# Patient Record
Sex: Male | Born: 1975 | Race: Black or African American | Hispanic: No | Marital: Single | Smoking: Current every day smoker
Health system: Southern US, Community
[De-identification: ages and names within clinical notes are randomized; demographics above are authoritative.]

## PROBLEM LIST (undated history)

## (undated) DIAGNOSIS — M549 Dorsalgia, unspecified: Secondary | ICD-10-CM

## (undated) HISTORY — PX: HERNIA REPAIR: SHX51

---

## 2006-06-21 ENCOUNTER — Emergency Department: Payer: Self-pay | Admitting: Emergency Medicine

## 2006-06-21 ENCOUNTER — Other Ambulatory Visit: Payer: Self-pay

## 2006-08-03 ENCOUNTER — Emergency Department: Payer: Self-pay | Admitting: Emergency Medicine

## 2006-08-03 ENCOUNTER — Other Ambulatory Visit: Payer: Self-pay

## 2013-09-20 ENCOUNTER — Emergency Department: Payer: Self-pay | Admitting: Emergency Medicine

## 2013-09-20 LAB — COMPREHENSIVE METABOLIC PANEL
ALBUMIN: 4.3 g/dL (ref 3.4–5.0)
ALT: 31 U/L (ref 12–78)
Alkaline Phosphatase: 97 U/L
Anion Gap: 5 — ABNORMAL LOW (ref 7–16)
BILIRUBIN TOTAL: 0.5 mg/dL (ref 0.2–1.0)
BUN: 11 mg/dL (ref 7–18)
CALCIUM: 9.5 mg/dL (ref 8.5–10.1)
CHLORIDE: 103 mmol/L (ref 98–107)
CREATININE: 1 mg/dL (ref 0.60–1.30)
Co2: 28 mmol/L (ref 21–32)
EGFR (African American): >60
EGFR (Non-African Amer.): >60
Glucose: 72 mg/dL (ref 65–99)
Osmolality: 270 (ref 275–301)
Potassium: 4 mmol/L (ref 3.5–5.1)
SGOT(AST): 45 U/L — ABNORMAL HIGH (ref 15–37)
SODIUM: 136 mmol/L (ref 136–145)
TOTAL PROTEIN: 8.5 g/dL — AB (ref 6.4–8.2)

## 2013-09-20 LAB — URINALYSIS, COMPLETE
Bacteria: NONE SEEN
Bilirubin,UR: NEGATIVE
Glucose,UR: NEGATIVE mg/dL (ref 0–75)
Ketone: NEGATIVE
Leukocyte Esterase: NEGATIVE
Nitrite: NEGATIVE
Ph: 5 (ref 4.5–8.0)
Protein: NEGATIVE
RBC,UR: <1 /HPF (ref 0–5)
Specific Gravity: 1.02 (ref 1.003–1.030)
Squamous Epithelial: NONE SEEN
WBC UR: <1 /HPF (ref 0–5)

## 2013-09-20 LAB — CBC WITH DIFFERENTIAL/PLATELET
BASOS ABS: 0.1 10*3/uL (ref 0.0–0.1)
Basophil %: 1.2 %
EOS PCT: 0.7 %
Eosinophil #: 0 10*3/uL (ref 0.0–0.7)
HCT: 42.4 % (ref 40.0–52.0)
HGB: 14.3 g/dL (ref 13.0–18.0)
LYMPHS ABS: 1.6 10*3/uL (ref 1.0–3.6)
Lymphocyte %: 30.6 %
MCH: 30.7 pg (ref 26.0–34.0)
MCHC: 33.7 g/dL (ref 32.0–36.0)
MCV: 91 fL (ref 80–100)
MONOS PCT: 5.7 %
Monocyte #: 0.3 x10 3/mm (ref 0.2–1.0)
Neutrophil #: 3.1 10*3/uL (ref 1.4–6.5)
Neutrophil %: 61.8 %
PLATELETS: 282 10*3/uL (ref 150–440)
RBC: 4.66 10*6/uL (ref 4.40–5.90)
RDW: 14 % (ref 11.5–14.5)
WBC: 5.1 10*3/uL (ref 3.8–10.6)

## 2013-09-20 LAB — LIPASE, BLOOD: Lipase: 139 U/L (ref 73–393)

## 2013-10-19 ENCOUNTER — Emergency Department: Payer: Self-pay | Admitting: Internal Medicine

## 2014-02-28 ENCOUNTER — Emergency Department: Payer: Self-pay | Admitting: Emergency Medicine

## 2014-02-28 LAB — URINALYSIS, COMPLETE
Bacteria: NONE SEEN
Bilirubin,UR: NEGATIVE
GLUCOSE, UR: NEGATIVE mg/dL (ref 0–75)
Ketone: NEGATIVE
Leukocyte Esterase: NEGATIVE
NITRITE: NEGATIVE
Ph: 6 (ref 4.5–8.0)
Protein: 30
Specific Gravity: 1.02 (ref 1.003–1.030)
Squamous Epithelial: NONE SEEN
WBC UR: <1 /HPF (ref 0–5)

## 2014-02-28 LAB — CBC WITH DIFFERENTIAL/PLATELET
BASOS PCT: 1.5 %
Basophil #: 0.1 10*3/uL (ref 0.0–0.1)
Eosinophil #: 0 10*3/uL (ref 0.0–0.7)
Eosinophil %: 1.2 %
HCT: 40.4 % (ref 40.0–52.0)
HGB: 13.4 g/dL (ref 13.0–18.0)
LYMPHS ABS: 1.2 10*3/uL (ref 1.0–3.6)
LYMPHS PCT: 30.8 %
MCH: 30.4 pg (ref 26.0–34.0)
MCHC: 33.1 g/dL (ref 32.0–36.0)
MCV: 92 fL (ref 80–100)
Monocyte #: 0.2 x10 3/mm (ref 0.2–1.0)
Monocyte %: 6.2 %
NEUTROS ABS: 2.4 10*3/uL (ref 1.4–6.5)
Neutrophil %: 60.3 %
Platelet: 322 10*3/uL (ref 150–440)
RBC: 4.39 10*6/uL — AB (ref 4.40–5.90)
RDW: 14.1 % (ref 11.5–14.5)
WBC: 3.9 10*3/uL (ref 3.8–10.6)

## 2014-02-28 LAB — COMPREHENSIVE METABOLIC PANEL
ALK PHOS: 78 U/L
Albumin: 3.8 g/dL (ref 3.4–5.0)
Anion Gap: 9 (ref 7–16)
BUN: 8 mg/dL (ref 7–18)
Bilirubin,Total: 0.6 mg/dL (ref 0.2–1.0)
CALCIUM: 8.6 mg/dL (ref 8.5–10.1)
CREATININE: 1.27 mg/dL (ref 0.60–1.30)
Chloride: 102 mmol/L (ref 98–107)
Co2: 27 mmol/L (ref 21–32)
EGFR (African American): >60
EGFR (Non-African Amer.): >60
Glucose: 86 mg/dL (ref 65–99)
Osmolality: 273 (ref 275–301)
Potassium: 4 mmol/L (ref 3.5–5.1)
SGOT(AST): 41 U/L — ABNORMAL HIGH (ref 15–37)
SGPT (ALT): 26 U/L
Sodium: 138 mmol/L (ref 136–145)
Total Protein: 8.2 g/dL (ref 6.4–8.2)

## 2014-02-28 LAB — TROPONIN I: Troponin-I: <0.02 ng/mL

## 2014-02-28 LAB — LIPASE, BLOOD: LIPASE: 110 U/L (ref 73–393)

## 2014-06-25 ENCOUNTER — Emergency Department: Payer: Self-pay | Admitting: Emergency Medicine

## 2014-12-11 ENCOUNTER — Emergency Department: Payer: Self-pay

## 2014-12-11 ENCOUNTER — Emergency Department
Admission: EM | Admit: 2014-12-11 | Discharge: 2014-12-11 | Disposition: A | Discharge status: Home / Self Care | Payer: Self-pay | Attending: Student | Admitting: Student

## 2014-12-11 DIAGNOSIS — M5136 Other intervertebral disc degeneration, lumbar region: Secondary | ICD-10-CM | POA: Insufficient documentation

## 2014-12-11 DIAGNOSIS — M5441 Lumbago with sciatica, right side: Secondary | ICD-10-CM | POA: Insufficient documentation

## 2014-12-11 MED ORDER — ORPHENADRINE CITRATE 30 MG/ML IJ SOLN
INTRAMUSCULAR | Status: AC
  Administered 2014-12-11: 60 mg via INTRAMUSCULAR
  Filled 2014-12-11: qty 2

## 2014-12-11 MED ORDER — CYCLOBENZAPRINE HCL 10 MG PO TABS: 10.0000 mg | ORAL_TABLET | Freq: Three times a day (TID) | ORAL | Status: DC | PRN

## 2014-12-11 MED ORDER — CYCLOBENZAPRINE HCL 10 MG PO TABS: 10.0000 mg | ORAL_TABLET | Freq: Three times a day (TID) | ORAL | Status: AC | PRN

## 2014-12-11 MED ORDER — KETOROLAC TROMETHAMINE 30 MG/ML IJ SOLN
INTRAMUSCULAR | Status: AC
  Administered 2014-12-11: 60 mg via INTRAMUSCULAR
  Filled 2014-12-11: qty 2

## 2014-12-11 MED ORDER — MELOXICAM 15 MG PO TABS
15.0000 mg | ORAL_TABLET | Freq: Every day | ORAL | Status: AC | PRN
Start: 2014-12-11 — End: ?

## 2014-12-11 MED ORDER — MELOXICAM 15 MG PO TABS: 15.0000 mg | ORAL_TABLET | Freq: Every day | ORAL | Status: DC | PRN

## 2014-12-11 MED ORDER — KETOROLAC TROMETHAMINE 30 MG/ML IJ SOLN
60.0000 mg | Freq: Once | INTRAMUSCULAR | Status: AC
  Administered 2014-12-11: 60 mg via INTRAMUSCULAR

## 2014-12-11 MED ORDER — ORPHENADRINE CITRATE 30 MG/ML IJ SOLN
60.0000 mg | Freq: Once | INTRAMUSCULAR | Status: AC
  Administered 2014-12-11: 60 mg via INTRAMUSCULAR

## 2014-12-11 NOTE — ED Notes (Signed)
Pt states that when he bent over he started having lower back pain, pt states that the pain will radiate down both of his legs at times, denies any known injury
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
## 2014-12-11 NOTE — Discharge Instructions (Signed)
Take medication as prescribed. Avoid strenuous activity. Avoid heavy lifting. Apply ice.  Follow up with orthopedic next week. See above to call and schedule. Follow up is very important.   REturn to ER for new or worsening concerns.   Back Exercises Back exercises help treat and prevent back injuries. The goal of back exercises is to increase the strength of your abdominal and back muscles and the flexibility of your back. These exercises should be started when you no longer have back pain. Back exercises include:  Pelvic Tilt. Lie on your back with your knees bent. Tilt your pelvis until the lower part of your back is against the floor. Hold this position 5 to 10 sec and repeat 5 to 10 times.  Knee to Chest. Pull first 1 knee up against your chest and hold for 20 to 30 seconds, repeat this with the other knee, and then both knees. This may be done with the other leg straight or bent, whichever feels better.  Sit-Ups or Curl-Ups. Bend your knees 90 degrees. Start with tilting your pelvis, and do a partial, slow sit-up, lifting your trunk only 30 to 45 degrees off the floor. Take at least 2 to 3 seconds for each sit-up. Do not do sit-ups with your knees out straight. If partial sit-ups are difficult, simply do the above but with only tightening your abdominal muscles and holding it as directed.  Hip-Lift. Lie on your back with your knees flexed 90 degrees. Push down with your feet and shoulders as you raise your hips a couple inches off the floor; hold for 10 seconds, repeat 5 to 10 times.  Back arches. Lie on your stomach, propping yourself up on bent elbows. Slowly press on your hands, causing an arch in your low back. Repeat 3 to 5 times. Any initial stiffness and discomfort should lessen with repetition over time.  Shoulder-Lifts. Lie face down with arms beside your body. Keep hips and torso pressed to floor as you slowly lift your head and shoulders off the floor. Do not overdo your  exercises, especially in the beginning. Exercises may cause you some mild back discomfort which lasts for a few minutes; however, if the pain is more severe, or lasts for more than 15 minutes, do not continue exercises until you see your caregiver. Improvement with exercise therapy for back problems is slow.  See your caregivers for assistance with developing a proper back exercise program. Document Released: 08/31/2004 Document Revised: 10/16/2011 Document Reviewed: 05/25/2011 [facility] Patient Information 2015 [place], [place]. This information is not intended to replace advice given to you by your health care provider. Make sure you discuss any questions you have with your health care provider.  Degenerative Disk Disease Degenerative disk disease is a condition caused by the changes that occur in the cushions of the backbone (spinal disks) as you grow older. Spinal disks are soft and compressible disks located between the bones of the spine (vertebrae). They act like shock absorbers. Degenerative disk disease can affect the whole spine. However, the neck and lower back are most commonly affected. Many changes can occur in the spinal disks with aging, such as:  The spinal disks may dry and shrink.  Small tears may occur in the tough, outer covering of the disk (annulus).  The disk space may become smaller due to loss of water.  Abnormal growths in the bone (spurs) may occur. This can put pressure on the nerve roots exiting the spinal canal, causing pain.  The spinal  canal may become narrowed. CAUSES  Degenerative disk disease is a condition caused by the changes that occur in the spinal disks with aging. The exact cause is not known, but there is a genetic basis for many patients. Degenerative changes can occur due to loss of fluid in the disk. This makes the disk thinner and reduces the space between the backbones. Small cracks can develop in the outer layer of the disk. This can lead to the  breakdown of the disk. You are more likely to get degenerative disk disease if you are overweight. Smoking cigarettes and doing heavy work such as weightlifting can also increase your risk of this condition. Degenerative changes can start after a sudden injury. Growth of bone spurs can compress the nerve roots and cause pain.  SYMPTOMS  The symptoms vary from person to person. Some people may have no pain, while others have severe pain. The pain may be so severe that it can limit your activities. The location of the pain depends on the part of your backbone that is affected. You will have neck or arm pain if a disk in the neck area is affected. You will have pain in your back, buttocks, or legs if a disk in the lower back is affected. The pain becomes worse while bending, reaching up, or with twisting movements. The pain may start gradually and then get worse as time passes. It may also start after a major or minor injury. You may feel numbness or tingling in the arms or legs.  DIAGNOSIS  Your caregiver will ask you about your symptoms and about activities or habits that may cause the pain. He or she may also ask about any injuries, diseases, or treatments you have had earlier. Your caregiver will examine you to check for the range of movement that is possible in the affected area, to check for strength in your extremities, and to check for sensation in the areas of the arms and legs supplied by different nerve roots. An X-ray of the spine may be taken. Your caregiver may suggest other imaging tests, such as magnetic resonance imaging (MRI), if needed.  TREATMENT  Treatment includes rest, modifying your activities, and applying ice and heat. Your caregiver may prescribe medicines to reduce your pain and may ask you to do some exercises to strengthen your back. In some cases, you may need surgery. You and your caregiver will decide on the treatment that is best for you. HOME CARE INSTRUCTIONS   Follow proper  lifting and walking techniques as advised by your caregiver.  Maintain good posture.  Exercise regularly as advised.  Perform relaxation exercises.  Change your sitting, standing, and sleeping habits as advised. Change positions frequently.  Lose weight as advised.  Stop smoking if you smoke.  Wear supportive footwear. SEEK MEDICAL CARE IF:  Your pain does not go away within 1 to 4 weeks. SEEK IMMEDIATE MEDICAL CARE IF:   Your pain is severe.  You notice weakness in your arms, hands, or legs.  You begin to lose control of your bladder or bowel movements. MAKE SURE YOU:   Understand these instructions.  Will watch your condition.  Will get help right away if you are not doing well or get worse. Document Released: 05/21/2007 Document Revised: 10/16/2011 Document Reviewed: 11/25/2013 [facility] Patient Information 2015 [place], [place]. This information is not intended to replace advice given to you by your health care provider. Make sure you discuss any questions you have with your health care  provider.  Back Pain, Adult Back pain is very common. The pain often gets better over time. The cause of back pain is usually not dangerous. Most people can learn to manage their back pain on their own.  HOME CARE   Stay active. Start with short walks on flat ground if you can. Try to walk farther each day.  Do not sit, drive, or stand in one place for more than 30 minutes. Do not stay in bed.  Do not avoid exercise or work. Activity can help your back heal faster.  Be careful when you bend or lift an object. Bend at your knees, keep the object close to you, and do not twist.  Sleep on a firm mattress. Lie on your side, and bend your knees. If you lie on your back, put a pillow under your knees.  Only take medicines as told by your doctor.  Put ice on the injured area.  Put ice in a plastic bag.  Place a towel between your skin and the bag.  Leave the ice on for 15-20 minutes,  03-04 times a day for the first 2 to 3 days. After that, you can switch between ice and heat packs.  Ask your doctor about back exercises or massage.  Avoid feeling anxious or stressed. Find good ways to deal with stress, such as exercise. GET HELP RIGHT AWAY IF:   Your pain does not go away with rest or medicine.  Your pain does not go away in 1 week.  You have new problems.  You do not feel well.  The pain spreads into your legs.  You cannot control when you poop (bowel movement) or pee (urinate).  Your arms or legs feel weak or lose feeling (numbness).  You feel sick to your stomach (nauseous) or throw up (vomit).  You have belly (abdominal) pain.  You feel like you may pass out (faint). MAKE SURE YOU:   Understand these instructions.  Will watch your condition.  Will get help right away if you are not doing well or get worse. Document Released: 01/10/2008 Document Revised: 10/16/2011 Document Reviewed: 11/25/2013 [facility] Patient Information 2015 [place], [place]. This information is not intended to replace advice given to you by your health care provider. Make sure you discuss any questions you have with your health care provider.

## 2015-01-28 ENCOUNTER — Emergency Department
Admission: EM | Admit: 2015-01-28 | Discharge: 2015-01-28 | Disposition: A | Discharge status: Home / Self Care | Payer: Self-pay | Attending: Emergency Medicine | Admitting: Emergency Medicine

## 2015-01-28 ENCOUNTER — Other Ambulatory Visit: Payer: Self-pay

## 2015-01-28 DIAGNOSIS — R112 Nausea with vomiting, unspecified: Secondary | ICD-10-CM | POA: Insufficient documentation

## 2015-01-28 DIAGNOSIS — R109 Unspecified abdominal pain: Secondary | ICD-10-CM | POA: Insufficient documentation

## 2015-01-28 DIAGNOSIS — J029 Acute pharyngitis, unspecified: Secondary | ICD-10-CM | POA: Insufficient documentation

## 2015-01-28 DIAGNOSIS — Z720 Tobacco use: Secondary | ICD-10-CM | POA: Insufficient documentation

## 2015-01-28 DIAGNOSIS — R197 Diarrhea, unspecified: Secondary | ICD-10-CM | POA: Insufficient documentation

## 2015-01-28 HISTORY — DX: Dorsalgia, unspecified: M54.9

## 2015-01-28 LAB — URINALYSIS COMPLETE WITH MICROSCOPIC (ARMC ONLY)
BILIRUBIN URINE: NEGATIVE
Bacteria, UA: NONE SEEN
GLUCOSE, UA: NEGATIVE mg/dL
Hgb urine dipstick: NEGATIVE
Ketones, ur: NEGATIVE mg/dL
LEUKOCYTES UA: NEGATIVE
Nitrite: NEGATIVE
PROTEIN: NEGATIVE mg/dL
Specific Gravity, Urine: 1.001 — ABNORMAL LOW (ref 1.005–1.030)
Squamous Epithelial / LPF: NONE SEEN
WBC UA: NONE SEEN WBC/hpf (ref 0–5)
pH: 6 (ref 5.0–8.0)

## 2015-01-28 LAB — COMPREHENSIVE METABOLIC PANEL
ALT: 32 U/L (ref 17–63)
AST: 47 U/L — ABNORMAL HIGH (ref 15–41)
Albumin: 4.5 g/dL (ref 3.5–5.0)
Alkaline Phosphatase: 81 U/L (ref 38–126)
Anion gap: 10 (ref 5–15)
BUN: 8 mg/dL (ref 6–20)
CALCIUM: 9.2 mg/dL (ref 8.9–10.3)
CO2: 26 mmol/L (ref 22–32)
CREATININE: 0.99 mg/dL (ref 0.61–1.24)
Chloride: 100 mmol/L — ABNORMAL LOW (ref 101–111)
GFR calc Af Amer: >60 mL/min (ref >60)
GFR calc non Af Amer: >60 mL/min (ref >60)
Glucose, Bld: 106 mg/dL — ABNORMAL HIGH (ref 65–99)
Potassium: 3.5 mmol/L (ref 3.5–5.1)
Sodium: 136 mmol/L (ref 135–145)
Total Bilirubin: 1 mg/dL (ref 0.3–1.2)
Total Protein: 8.5 g/dL — ABNORMAL HIGH (ref 6.5–8.1)

## 2015-01-28 LAB — CBC WITH DIFFERENTIAL/PLATELET
BASOS PCT: 1 %
Basophils Absolute: 0 10*3/uL (ref 0–0.1)
EOS PCT: 1 %
Eosinophils Absolute: 0 10*3/uL (ref 0–0.7)
HCT: 41.9 % (ref 40.0–52.0)
HEMOGLOBIN: 14 g/dL (ref 13.0–18.0)
Lymphocytes Relative: 29 %
Lymphs Abs: 1.1 10*3/uL (ref 1.0–3.6)
MCH: 30.3 pg (ref 26.0–34.0)
MCHC: 33.3 g/dL (ref 32.0–36.0)
MCV: 90.7 fL (ref 80.0–100.0)
MONOS PCT: 9 %
Monocytes Absolute: 0.4 10*3/uL (ref 0.2–1.0)
NEUTROS ABS: 2.4 10*3/uL (ref 1.4–6.5)
Neutrophils Relative %: 60 %
Platelets: 227 10*3/uL (ref 150–440)
RBC: 4.61 MIL/uL (ref 4.40–5.90)
RDW: 13.2 % (ref 11.5–14.5)
WBC: 3.9 10*3/uL (ref 3.8–10.6)

## 2015-01-28 LAB — LIPASE, BLOOD: LIPASE: 56 U/L — AB (ref 22–51)

## 2015-01-28 MED ORDER — METOCLOPRAMIDE HCL 10 MG PO TABS
10.0000 mg | ORAL_TABLET | Freq: Once | ORAL | Status: AC
  Administered 2015-01-28: 10 mg via ORAL

## 2015-01-28 MED ORDER — METOCLOPRAMIDE HCL 10 MG PO TABS
ORAL_TABLET | ORAL | Status: AC
  Administered 2015-01-28: 10 mg via ORAL
  Filled 2015-01-28: qty 1

## 2015-01-28 MED ORDER — METOCLOPRAMIDE HCL 5 MG PO TABS: 5.0000 mg | ORAL_TABLET | Freq: Three times a day (TID) | ORAL | Status: AC

## 2015-01-28 NOTE — Discharge Instructions (Signed)

## 2015-01-28 NOTE — ED Notes (Signed)
Pt c/o vomiting and diarrhea that began last night. Also reports center abdominal pain.

## 2015-01-28 NOTE — ED Notes (Signed)
Pt reports feeling "hot" and head pain beginning yesterday. Pt reports coughing up yellow colored sputum since last night and having throat irritation.

## 2015-01-28 NOTE — ED Provider Notes (Signed)
[facility] Emergency Department Provider Note  ____________________________________________  Time seen: 1451   I have reviewed the triage vital signs and the nursing notes.   HISTORY  Chief Complaint Emesis; Abdominal Pain; and Diarrhea     HPI Barry Olson is a 39 y.o. male reports he woke up this morning and did not feel well. He felt warm and had significant and momentarily. He went to the bathroom and had some diarrhea and left the bathroom only to return minute later and vomited. Following emesis, he reports his throat does not feel well. It is a little uncomfortable when he swallows. He has tried to eat other foods today but has not been able to tolerate much. He did have some abdominal pain. He points towards the middle of his belly just above the umbilicus, but that pain is now gone. He had a mild headache as well but that is improved. He has not taking any medicines today except for an 800 mg ibuprofen area.     Past Medical History  Diagnosis Date  . Back pain     There are no active problems to display for this patient.   History reviewed. No pertinent past surgical history.  Current Outpatient Rx  Name  Route  Sig  Dispense  Refill  . cyclobenzaprine (FLEXERIL) 10 MG tablet   Oral   Take 1 tablet (10 mg total) by mouth every 8 (eight) hours as needed for muscle spasms (PRN pain. Do not drive or operate heavy machinery while taking as can cause drowsiness.).   12 tablet   0   . meloxicam (MOBIC) 15 MG tablet   Oral   Take 1 tablet (15 mg total) by mouth daily as needed for pain.   10 tablet   0   . metoCLOPramide (REGLAN) 5 MG tablet   Oral   Take 1 tablet (5 mg total) by mouth 3 (three) times daily.   15 tablet   0     Allergies Review of patient's allergies indicates no known allergies.  No family history on file.  Social History History  Substance Use Topics  . Smoking status: Current Every Day Smoker  .  Smokeless tobacco: Not on file  . Alcohol Use: Yes    Review of Systems  Constitutional: Negative for fever. ENT: Positive for sore throat. See history of present illness Cardiovascular: Negative for chest pain. Respiratory: Negative for shortness of breath. Gastrointestinal: Positive for abdominal pain, vomiting and diarrhea. See history of present illness . Genitourinary: Negative for dysuria. Musculoskeletal: Patient reports that he has some back problems. Has an appointment was [facility] clinic soon for further evaluation of this. Skin: Negative for rash. Neurological: Negative for headaches   10-point ROS otherwise negative.  ____________________________________________   PHYSICAL EXAM:  VITAL SIGNS: ED Triage Vitals  Enc Vitals Group     BP 01/28/15 1324 142/94 mmHg     Pulse --      Resp 01/28/15 1324 20     Temp 01/28/15 1324 98.4 F (36.9 C)     Temp Source 01/28/15 1324 Oral     SpO2 01/28/15 1324 96 %     Weight 01/28/15 1324 170 lb (77.111 kg)     Height 01/28/15 1324 5\' 11"  (1.803 m)     Head Cir --      Peak Flow --      Pain Score 01/28/15 1329 10     Pain Loc --  Pain Edu? --      Excl. in GC? --     Constitutional:  Alert and oriented. Well appearing and in no distress. ENT   Head: Normocephalic and atraumatic.   Nose: No congestion/rhinnorhea.   Mouth/Throat: Mucous membranes are moist. No erythema, no swelling. Neck:  Minimal lymphadenopathy Cardiovascular: Normal rate, regular rhythm, no murmur noted Respiratory:  Normal respiratory effort, no tachypnea.    Breath sounds are clear and equal bilaterally.  Gastrointestinal: Soft and nontender. No distention. Benign exam Back: No muscle spasm, no tenderness, no CVA tenderness. Musculoskeletal: No deformity noted. Nontender with normal range of motion in all extremities.  No noted edema. Neurologic:  Normal speech and language. No gross focal neurologic deficits are appreciated.   Skin:  Skin is warm, dry. No rash noted. Psychiatric: Patient is a little bit vague and uses minimal words and describing his ailment. Otherwise, he appears to have an intact thought process and a normal affect.  ____________________________________________    LABS (pertinent positives/negatives)  Metabolic panel is generally within normal limits with minimal elevation of AST at 47. CBC is normal with white blood cell count of 3.9 Lipase is slightly elevated at 56. Urinalysis is negative for blood or infection. Negative for ketones. ____________________________________________   EKG  ED ECG REPORT I, Cooper Moroney W, the attending physician, personally viewed and interpreted this ECG.   Date: 01/28/2015  EKG Time: 1336  Rate: 105  Rhythm: Sinus tachycardia  Axis: Normal  Intervals: Normal  ST&T Change: Flat T in 3, aVF, V5 V6   ____________________________________________    RADIOLOGY    ____________________________________________   PROCEDURES    ____________________________________________   INITIAL IMPRESSION / ASSESSMENT AND PLAN / ED COURSE  Pertinent labs & imaging results that were available during my care of the patient were reviewed by me and considered in my medical decision making (see chart for details).   Generally well-appearing 39 year old male in no acute distress. With his nausea vomiting diarrhea but a benign abdomen, I believe he likely has a benign gastrointestinal infection. He reports his abdomen feels better and he has no nausea at this time. He has had difficulty with his appetite or keeping food down today. With this in mind and after discussing with the patient, I will treat the patient with Reglan. I'll write a prescription for additional Reglan as needed.  ____________________________________________   FINAL CLINICAL IMPRESSION(S) / ED DIAGNOSES  Final diagnoses:  Non-intractable vomiting with nausea, vomiting of unspecified  type  Diarrhea      Darien Ramus, MD 01/28/15 [phone]
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
## 2017-09-04 ENCOUNTER — Other Ambulatory Visit: Payer: Self-pay

## 2017-09-04 ENCOUNTER — Emergency Department: Payer: Self-pay

## 2017-09-04 ENCOUNTER — Encounter: Payer: Self-pay | Admitting: Emergency Medicine

## 2017-09-04 ENCOUNTER — Emergency Department
Admission: EM | Admit: 2017-09-04 | Discharge: 2017-09-04 | Disposition: A | Discharge status: Home / Self Care | Payer: Self-pay | Attending: Emergency Medicine | Admitting: Emergency Medicine

## 2017-09-04 DIAGNOSIS — Z79899 Other long term (current) drug therapy: Secondary | ICD-10-CM | POA: Insufficient documentation

## 2017-09-04 DIAGNOSIS — R079 Chest pain, unspecified: Secondary | ICD-10-CM | POA: Insufficient documentation

## 2017-09-04 DIAGNOSIS — J069 Acute upper respiratory infection, unspecified: Secondary | ICD-10-CM | POA: Insufficient documentation

## 2017-09-04 DIAGNOSIS — F17200 Nicotine dependence, unspecified, uncomplicated: Secondary | ICD-10-CM | POA: Insufficient documentation

## 2017-09-04 LAB — BASIC METABOLIC PANEL
ANION GAP: 10 (ref 5–15)
BUN: 11 mg/dL (ref 6–20)
CO2: 24 mmol/L (ref 22–32)
Calcium: 9 mg/dL (ref 8.9–10.3)
Chloride: 102 mmol/L (ref 101–111)
Creatinine, Ser: 1.14 mg/dL (ref 0.61–1.24)
GLUCOSE: 89 mg/dL (ref 65–99)
POTASSIUM: 3.5 mmol/L (ref 3.5–5.1)
SODIUM: 136 mmol/L (ref 135–145)

## 2017-09-04 LAB — CBC
HEMATOCRIT: 37.5 % — AB (ref 40.0–52.0)
HEMOGLOBIN: 12.6 g/dL — AB (ref 13.0–18.0)
MCH: 30.3 pg (ref 26.0–34.0)
MCHC: 33.5 g/dL (ref 32.0–36.0)
MCV: 90.3 fL (ref 80.0–100.0)
Platelets: 203 10*3/uL (ref 150–440)
RBC: 4.16 MIL/uL — ABNORMAL LOW (ref 4.40–5.90)
RDW: 14.4 % (ref 11.5–14.5)
WBC: 6.1 10*3/uL (ref 3.8–10.6)

## 2017-09-04 LAB — TROPONIN I: Troponin I: <0.03 ng/mL (ref <0.03)

## 2017-09-04 MED ORDER — GUAIFENESIN ER 600 MG PO TB12: 600.0000 mg | ORAL_TABLET | Freq: Two times a day (BID) | ORAL | 0 refills | Status: AC

## 2017-09-04 MED ORDER — IPRATROPIUM-ALBUTEROL 0.5-2.5 (3) MG/3ML IN SOLN
3.0000 mL | Freq: Once | RESPIRATORY_TRACT | Status: AC
  Administered 2017-09-04: 3 mL via RESPIRATORY_TRACT
  Filled 2017-09-04: qty 3

## 2017-09-04 MED ORDER — ACETAMINOPHEN 325 MG PO TABS
650.0000 mg | ORAL_TABLET | Freq: Once | ORAL | Status: AC
  Administered 2017-09-04: 650 mg via ORAL
  Filled 2017-09-04: qty 2

## 2017-09-04 NOTE — ED Notes (Signed)
Pt c/o cough with congestion for the past couple of days, states having some pain in chest with the coughing. Respirations WNL, skin is warm and dry. States he has not taken any OTC meds for cold or cough.

## 2017-09-04 NOTE — ED Provider Notes (Addendum)
[facility] Emergency Department Provider Note   ____________________________________________    I have reviewed the triage vital signs and the nursing notes.   HISTORY  Chief Complaint Cough and Chest Pain     HPI Barry Olson is a 42 y.o. male who presents with complaints of cough and chest discomfort.  Patient reports he developed cough 3 days ago as well as runny nose and fatigue.  He reports cough is productive of yellow sputum over the last 2 days he has felt discomfort whenever he coughs like his chest is raw.  Has not taken anything for this.  No fevers or chills.  No myalgias.  No nausea or vomiting or abdominal pain   Past Medical History:  Diagnosis Date  . Back pain     There are no active problems to display for this patient.   Past Surgical History:  Procedure Laterality Date  . HERNIA REPAIR      Prior to Admission medications   Medication Sig Start Date End Date Taking? Authorizing Provider  cyclobenzaprine (FLEXERIL) 10 MG tablet Take 1 tablet (10 mg total) by mouth every 8 (eight) hours as needed for muscle spasms (PRN pain. Do not drive or operate heavy machinery while taking as can cause drowsiness.). 12/11/14   Renford Dills, NP  guaiFENesin (MUCINEX) 600 MG 12 hr tablet Take 1 tablet (600 mg total) by mouth 2 (two) times daily for 10 days. 09/04/17 09/14/17  Jene Every, MD  meloxicam (MOBIC) 15 MG tablet Take 1 tablet (15 mg total) by mouth daily as needed for pain. 12/11/14   Renford Dills, NP  metoCLOPramide (REGLAN) 5 MG tablet Take 1 tablet (5 mg total) by mouth 3 (three) times daily. 01/28/15   Darien Ramus, MD     Allergies Patient has no known allergies.  No family history on file.  Social History Social History   Tobacco Use  . Smoking status: Current Every Day Smoker  . Smokeless tobacco: Never Used  Substance Use Topics  . Alcohol use: Yes  . Drug use: Not on file    Review of  Systems  Constitutional: No fever/chills Eyes: No visual changes.  ENT: No sore throat. Cardiovascular: As above Respiratory: Denies shortness of breath.  Cough Gastrointestinal: No abdominal pain.  Genitourinary: Negative for dysuria. Musculoskeletal: No myalgias Skin: Negative for rash. Neurological: Negative for headaches    ____________________________________________   PHYSICAL EXAM:  VITAL SIGNS: ED Triage Vitals  Enc Vitals Group     BP 09/04/17 1039 (!) 181/110     Pulse Rate 09/04/17 1039 90     Resp 09/04/17 1039 18     Temp 09/04/17 1039 97.8 F (36.6 C)     Temp Source 09/04/17 1039 Oral     SpO2 09/04/17 1039 98 %     Weight 09/04/17 1040 77.1 kg (170 lb)     Height 09/04/17 1040 1.803 m (5\' 11" )     Head Circumference --      Peak Flow --      Pain Score 09/04/17 1039 7     Pain Loc --      Pain Edu? --      Excl. in GC? --     Constitutional: Alert and oriented. No acute distress. Pleasant and interactive Eyes: Conjunctivae are normal.   Nose: Positive congestion Mouth/Throat: Mucous membranes are moist.    Cardiovascular: Normal rate, regular rhythm. Grossly normal heart sounds.  Good peripheral circulation. Respiratory:  Normal respiratory effort.  No retractions. Lungs CTAB.  Scattered minimal wheezing Gastrointestinal: Soft and nontender. No distention.   Genitourinary: deferred Musculoskeletal:   Warm and well perfused Neurologic:  Normal speech and language. No gross focal neurologic deficits are appreciated.  Skin:  Skin is warm, dry and intact. No rash noted. Psychiatric: Mood and affect are normal. Speech and behavior are normal.  ____________________________________________   LABS (all labs ordered are listed, but only abnormal results are displayed)  Labs Reviewed  CBC - Abnormal; Notable for the following components:      Result Value   RBC 4.16 (*)    Hemoglobin 12.6 (*)    HCT 37.5 (*)    All other components within normal  limits  BASIC METABOLIC PANEL  TROPONIN I   ____________________________________________  EKG  ED ECG REPORT I, Jene Everyobert Jamire Shabazz, the attending physician, personally viewed and interpreted this ECG.  Date: 09/04/2017  Rhythm: normal sinus rhythm QRS Axis: normal Intervals: normal ST/T Wave abnormalities: normal Narrative Interpretation: no evidence of acute ischemia  ____________________________________________  RADIOLOGY  Chest x-ray unremarkable ____________________________________________   PROCEDURES  Procedure(s) performed: No  Procedures   Critical Care performed: No ____________________________________________   INITIAL IMPRESSION / ASSESSMENT AND PLAN / ED COURSE  Pertinent labs & imaging results that were available during my care of the patient were reviewed by me and considered in my medical decision making (see chart for details).  Patient well-appearing in no acute distress.  Exam is most consistent with upper respiratory infection, no indication of bacterial illness.  No indication that this is related ACS.  EKG normal.  Troponin normal.  Lab work unremarkable.  Treated with DuoNeb and will discharge with Mucinex recommend supportive care outpatient follow-up as needed.  Return precautions discussed    ____________________________________________   FINAL CLINICAL IMPRESSION(S) / ED DIAGNOSES  Final diagnoses:  Viral upper respiratory tract infection        Note:  This document was prepared using Dragon voice recognition software and may include unintentional dictation errors.    Jene EveryKinner, Sharika Mosquera, MD 09/04/17 1230    Jene EveryKinner, Alan Riles, MD 09/04/17 1231

## 2017-09-04 NOTE — ED Triage Notes (Signed)
C/O productive cough and chest pain with cough and deep breathing and sinus congestion x 2 days.

## 2017-09-20 ENCOUNTER — Other Ambulatory Visit: Payer: Self-pay

## 2017-09-20 ENCOUNTER — Encounter: Payer: Self-pay | Admitting: Emergency Medicine

## 2017-09-20 ENCOUNTER — Emergency Department
Admission: EM | Admit: 2017-09-20 | Discharge: 2017-09-20 | Disposition: A | Discharge status: Home / Self Care | Payer: Self-pay | Attending: Emergency Medicine | Admitting: Emergency Medicine

## 2017-09-20 DIAGNOSIS — Z79899 Other long term (current) drug therapy: Secondary | ICD-10-CM | POA: Insufficient documentation

## 2017-09-20 DIAGNOSIS — F17200 Nicotine dependence, unspecified, uncomplicated: Secondary | ICD-10-CM | POA: Insufficient documentation

## 2017-09-20 DIAGNOSIS — H1031 Unspecified acute conjunctivitis, right eye: Secondary | ICD-10-CM | POA: Insufficient documentation

## 2017-09-20 MED ORDER — TOBRAMYCIN 0.3 % OP SOLN: 2.0000 [drp] | OPHTHALMIC | 0 refills | Status: AC

## 2017-09-20 NOTE — ED Triage Notes (Signed)
Presents with pain and swelling to right eye  Sx's started yesterday

## 2017-09-20 NOTE — Discharge Instructions (Signed)
Follow-up with your regular doctor or the ophthalmologist who we have referred you to if you are not better in 3 days.  Use medication as prescribed.  You are contagious until you have had at least 24 hours of eyedrops.  He will be given a work note to indicate this.  Please wash your hands every time you touch her eye.

## 2017-09-20 NOTE — ED Provider Notes (Signed)
[facility] Emergency Department Provider Note  ____________________________________________   First MD Initiated Contact with Patient 09/20/17 1009     (approximate)  I have reviewed the triage vital signs and the nursing notes.   HISTORY  Chief Complaint Eye Pain    HPI Barry Olson is a 42 y.o. male until swelling to the right eye with some denies any cold symptoms.  He states that the eye has been matted in the mornings.  He was sent home from work yesterday because they are afraid he has pinkeye.  He denies any visual changes.  Past Medical History:  Diagnosis Date  . Back pain     There are no active problems to display for this patient.   Past Surgical History:  Procedure Laterality Date  . HERNIA REPAIR      Prior to Admission medications   Medication Sig Start Date End Date Taking? Authorizing Provider  cyclobenzaprine (FLEXERIL) 10 MG tablet Take 1 tablet (10 mg total) by mouth every 8 (eight) hours as needed for muscle spasms (PRN pain. Do not drive or operate heavy machinery while taking as can cause drowsiness.). 12/11/14   Renford Dills, NP  meloxicam (MOBIC) 15 MG tablet Take 1 tablet (15 mg total) by mouth daily as needed for pain. 12/11/14   Renford Dills, NP  metoCLOPramide (REGLAN) 5 MG tablet Take 1 tablet (5 mg total) by mouth 3 (three) times daily. 01/28/15   Darien Ramus, MD  tobramycin (TOBREX) 0.3 % ophthalmic solution Place 2 drops into the left eye every 4 (four) hours. 09/20/17   Faythe Ghee, PA-C    Allergies Patient has no known allergies.  No family history on file.  Social History Social History   Tobacco Use  . Smoking status: Current Every Day Smoker  . Smokeless tobacco: Never Used  Substance Use Topics  . Alcohol use: Yes  . Drug use: Not on file    Review of Systems  Constitutional: No fever/chills Eyes: No visual changes.  Positive for right eye redness and swelling ENT: No sore  throat. Respiratory: Denies cough Genitourinary: Negative for dysuria. Musculoskeletal: Negative for back pain. Skin: Negative for rash.    ____________________________________________   PHYSICAL EXAM:  VITAL SIGNS: ED Triage Vitals  Enc Vitals Group     BP 09/20/17 1011 (!) 149/87     Pulse Rate 09/20/17 1011 77     Resp 09/20/17 1011 16     Temp 09/20/17 1011 97.8 F (36.6 C)     Temp Source 09/20/17 1011 Oral     SpO2 09/20/17 1011 98 %     Weight 09/20/17 0927 170 lb (77.1 kg)     Height 09/20/17 0927 5\' 11"  (1.803 m)     Head Circumference --      Peak Flow --      Pain Score 09/20/17 0927 5     Pain Loc --      Pain Edu? --      Excl. in GC? --     Constitutional: Alert and oriented. Well appearing and in no acute distress. Eyes: Conjunctiva of the right eye is red and swollen.  There is no obvious drainage or matting at this time.  PE RRL.  Patient's vision is grossly intact.  Head: Atraumatic. Nose: No congestion/rhinnorhea. Mouth/Throat: Mucous membranes are moist.  Throat is normal Neck: Is supple, no lymphadenopathy is palpated Cardiovascular: Normal rate, regular rhythm.  Heart sounds are normal Respiratory: Normal  respiratory effort.  No retractions, lungs are clear to auscultation GU: deferred Musculoskeletal: FROM all extremities, warm and well perfused Neurologic:  Normal speech and language.  Skin:  Skin is warm, dry and intact. No rash noted. Psychiatric: Mood and affect are normal. Speech and behavior are normal.  ____________________________________________   LABS (all labs ordered are listed, but only abnormal results are displayed)  Labs Reviewed - No data to display ____________________________________________   ____________________________________________  RADIOLOGY    ____________________________________________   PROCEDURES  Procedure(s) performed:  No  Procedures    ____________________________________________   INITIAL IMPRESSION / ASSESSMENT AND PLAN / ED COURSE  Pertinent labs & imaging results that were available during my care of the patient were reviewed by me and considered in my medical decision making (see chart for details).  Patient is a 42 year old male presents emergency department complains of right eye redness and swelling.  Physical exam the right eye has injected conjunctiva.  Patient's vision is intact.  There is no drainage or matting at this time diagnosis is acute conjunctivitis  A prescription for tobramycin ophthalmic drops was provided.  Work note was provided.  Patient was instructed to wash his hands frequently especially after he touches his eye.  He was informed that he is contagious until he has had at least 24 hours of antibiotic eyedrops.  Patient states he understands will comply with the recommendations.  He was discharged in stable condition     As part of my medical decision making, I reviewed the following data within the electronic MEDICAL RECORD NUMBER Nursing notes reviewed and incorporated, Old chart reviewed, Notes from prior ED visits  ____________________________________________   FINAL CLINICAL IMPRESSION(S) / ED DIAGNOSES  Final diagnoses:  Acute bacterial conjunctivitis of right eye      NEW MEDICATIONS STARTED DURING THIS VISIT:  New Prescriptions   TOBRAMYCIN (TOBREX) 0.3 % OPHTHALMIC SOLUTION    Place 2 drops into the left eye every 4 (four) hours.     Note:  This document was prepared using Dragon voice recognition software and may include unintentional dictation errors.    Faythe GheeFisher, Haakon Titsworth W, PA-C 09/20/17 1034    Don PerkingVeronese, [place], MD 09/20/17 [phone]

## 2021-02-15 ENCOUNTER — Ambulatory Visit: Payer: Medicaid Other | Admitting: Physical Therapy

## 2021-02-23 ENCOUNTER — Ambulatory Visit: Payer: Self-pay | Admitting: Physical Therapy

## 2021-03-02 ENCOUNTER — Ambulatory Visit: Payer: Self-pay | Admitting: Physical Therapy

## 2021-03-09 ENCOUNTER — Ambulatory Visit: Payer: Self-pay | Admitting: Physical Therapy

## 2021-04-18 ENCOUNTER — Ambulatory Visit: Payer: Medicaid Other | Attending: Internal Medicine | Admitting: Physical Therapy

## 2021-04-18 ENCOUNTER — Other Ambulatory Visit: Payer: Self-pay

## 2021-04-18 ENCOUNTER — Encounter: Payer: Self-pay | Admitting: Physical Therapy

## 2021-04-18 DIAGNOSIS — I69354 Hemiplegia and hemiparesis following cerebral infarction affecting left non-dominant side: Secondary | ICD-10-CM | POA: Insufficient documentation

## 2021-04-18 DIAGNOSIS — M6281 Muscle weakness (generalized): Secondary | ICD-10-CM | POA: Insufficient documentation

## 2021-04-18 DIAGNOSIS — Z7409 Other reduced mobility: Secondary | ICD-10-CM | POA: Insufficient documentation

## 2021-04-18 NOTE — Patient Instructions (Signed)
Access Code: [email or web address] URL: [email or web address] Date: 04/18/2021 Prepared by: Dorene Grebe  Exercises Seated AAROM Elbow Flexion/Extension with Clasped Hands - 3 x daily - 7 x weekly - 1 sets - 15 reps Supine Shoulder Flexion AAROM - 3 x daily - 7 x weekly - 1 sets - 15 reps Supine Active Straight Leg Raise - 1 x daily - 7 x weekly - 3 sets - 10 reps Supine Heel Slides - 1 x daily - 7 x weekly - 3 sets - 10 reps

## 2021-04-18 NOTE — Therapy (Signed)
[facility] [address] [place], [place], [address] Phone: [phone]   Fax:  [phone]  Physical Therapy Treatment  Patient Details  Name: Barry Olson MRN: [id number] Date of Birth: January 11, 1976 Referring Provider (PT): Marcelino Duster, MD  Encounter Date: 04/18/2021   PT End of Session - 04/18/21 1636     Visit Number 1    Number of Visits 12    Date for PT Re-Evaluation 05/30/21    Authorization - Visit Number 1    Authorization - Number of Visits 10    Progress Note Due on Visit 10    PT Start Time 1116    PT Stop Time 1204    PT Time Calculation (min) 48 min    Equipment Utilized During Treatment Gait belt    Activity Tolerance Patient tolerated treatment well;Patient limited by pain;Patient limited by fatigue;No increased pain;Patient limited by lethargy    Behavior During Therapy [facility] for tasks assessed/performed;Flat affect             Past Medical History:  Diagnosis Date   Back pain     Past Surgical History:  Procedure Laterality Date   HERNIA REPAIR      There were no vitals filed for this visit.   Subjective Assessment - 04/18/21 1614     Subjective Pt. 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 Pt states that all impairments are on the L side. Pt has limiations of the LLE but no use of the LUE. Pt is current max A to TD with all transfers and mobility in the home. Pt has LBP when in seated posture and requires freq transfers back to the bed    Pertinent History Strong family support, Pt's mother would like pt to walk again to assist with transfers. Pt would like to return use of LUE. Pt was standing with modified walking when d/c from hospital/rehab last year.    Limitations Walking;Lifting;Standing;House hold activities    How long can you sit comfortably? 5-10 mins    How long can you stand comfortably? Unable due to  caregiver fear of falling.    How long can you walk comfortably? Unable    Diagnostic tests x-ray 10/06/20: WNL    Patient Stated Goals Increase use of LUE, Walking and preform transfers with independence.    Currently in Pain? Other (Comment)   Presents with moderate to severe pain unable to provided NPS   Pain Location Back    Pain Descriptors / Indicators Aching    Pain Type Chronic pain    Pain Onset More than a month ago    Pain Frequency Intermittent    Aggravating Factors  Seated posture    Pain Relieving Factors Laying down    Effect of Pain on Daily Activities limits seated time.                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  Yes    Is the patient reluctant to leave their home because of a fear of falling?  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 Pt states that all impairments are in the L side. Pt has minor ability of the LLE but no use of the LUE. Pt is current max A to TD with all transfers and mobility in the home. Pt has LBP when in seated posture and requires freq transfers back to the bed. History:  Strong family support. Current Outpatient Medications: Medication Sig  Dispense Refill   amLODIPine (NORVASC) 5 MG tablet Take 1 tablet (5 mg total) by mouth once daily 30 tablet 5   baclofen (LIORESAL) 5 mg tablet Take 1 tablet (5 mg total) by mouth nightly 30 tablet 5   cyanocobalamin (VITAMIN B12) 1000 MCG tablet Take 1 tablet (1,000 mcg total) by mouth once daily 30 tablet 1   lisinopriL (ZESTRIL) 40 MG tablet Take 1 tablet (40 mg total) by G tube nightly 30 tablet 5   traZODone (DESYREL) 50 MG tablet Take 0.5 tablets (25 mg total) by mouth nightly 15 tablet 1   gabapentin (NEURONTIN) 300 MG capsule Take 1 capsule (300 mg total) by mouth 2 (two) times daily 60 capsule 11.  Referring Dx: left hemiparesis  Referring Provider: Marcelino Duster  Pain location: L shoulder, LBP and LLE Pain: Present moderate to severe, unable to provided NPS Pain quality: deferred  Radiating pain: deferred Numbness/Tingling: deferred 24 hour pain behavior: deffered Aggravating factors: Sitting  Easing factors: laying down How long can you sit: 5-10 ,mins How long can you stand: Not current standing How long can you walk: unable History of back injury, pain, surgery, or therapy:  Follow-up appointment with MD: Pt had extensive  Imaging: x-ray 10/06/20: WNL Falls in the last 6 months: No falls.  Occupational demands: unemployed  Hobbies: Fishing, current unable Goals: Increase use of LUE, Walking and preform transfers with independence. Red flags (bowel/bladder changes, saddle paresthesia, personal history of cancer, chills/fever, night sweats, unrelenting pain, first onset of insidious LBP <20 y/o) Negative    OBJECTIVE   FOTO: 12 with a predicted value of 38  Mental Status Pt displays with dysphagia, flat affect, or deficits in motor planning.   SENSATION: Deferred to lateral tx    MUSCULOSKELETAL: Tremor: None Bulk: Normal Tone: increased spasticity  during LUE/LLE PROM  Positive  sulcus sign at L [facility] joint  Posture Spine: Lateral lean to L  Shoulders: severe  L shoulder depression compared to L   Gait Unable/deferred   Palpation - Positive  sulcus sign at L GH joint -joint line tenderness in R knee  -High tone of L bicep, tricep, quad, hamstring, and gastroc.    Strength (out of 5) R/L 4/4- Hip flexion 4/4- Hip abduction 4/4- Hip adduction 4/4- Knee extension 4/4- Knee flexion 4/4- Ankle dorsiflexion   *Indicates pain     PROM (degrees) R/L (all movements include overpressure unless otherwise stated)  Lumbar forward flexion (65):  Lumbar extension (30): Lumbar lateral flexion (25): R:  L:  Thoracic and Lumbar rotation (30 degrees):  R: degrees L: degrees Hip IR (0-45): WNL Hip ER (0-45):WNL Hip Flexion (0-125): WNL Hip extension (0-15): deferred  Knee Flexion (0-135): WNL Knee Extension (0): R: WNL L lacking 20 degs  Ankle DF (0-35) R WFL. LL unable to obtain 2/2 clonus  Ankle PL (0-50) : WNL   UE Strength Pt has slight-to-no volitional control of LUE RUE: deferred  AROM R UE WFL shoulder flexion, extension, HBH, HBB   SPECIAL TESTS Lumbar Radiculopathy and Discogenic: SLR (SN 92, -LR 0.29): neg Crossed SLR (SP 90): neg    Hip: FABER (SN 81): neg FADIR (SN 94): neg :   Functional assessment:  Bed mobility: I  Transfers: MaxA- TD when to L. CGA-Min A with sliding board mat table to w/c when to R Standing: Max A  Walking: deferred to later tx      PT Education - 04/18/21 1200     Education Details Access Code: [email or web address]    Person(s) Educated Patient    Methods Explanation;Demonstration;Handout    Comprehension Verbalized understanding;Returned demonstration                 PT Long Term Goals - 04/18/21 1658       PT LONG TERM GOAL #1   Title Pt will increase FOTO score to predicted value of 38 for improvement in self-reported function with ADL    Baseline 12    Time 6    Period Weeks    Status New    Target Date 05/30/21      PT LONG TERM GOAL #2   Title Pt will be Mod I with  bed-to-w/c transfers when transfering to the R with sliding board and occasional cueing    Baseline CGA-minA with therapist. max A-TD with mother.    Time 6    Period Weeks    Status New    Target Date 05/30/21      PT LONG TERM GOAL #3   Title Pt will be independent with HEP in order to improve strength and decrease back pain in order to improve seated tolerance at home    Baseline Pt current states severe LBP during seated posuture of 5-10 mins    Time 6    Period Weeks    Status New    Target Date 05/30/21      PT LONG TERM GOAL #4   Title Pt will tolerate 5 mins of standing in // bars with Min A to promote WB and decreased pressure to post pelvic in seated/supine posture    Baseline deferred to later tx.    Time 6    Period Weeks    Status New    Target Date 05/30/21                   Plan - 04/18/21 1637     Clinical Impression Statement Clinical Impression: Pt is a pleasant 45 year-old male referred for:L hemiparesis/hemiplegia post stroke. PT examination reveals the following deficits: Pt displays with dysphagia, flat affect, or deficits in motor planning. Tone: increased spasticity during LUE/LLE PROM.   Positive  sulcus sign at L GH joint. Functional assessment: Bed mobility: I,Transfers: MaxA- TD when to L. CGA-Min A with sliding board mat table to w/c when to R. Standing: Max A. Walking: deferred to later tx. FOTO of 12 with predicted value of 38.  Seated tolerance of 5-10 minutes. Pt case displays as moderate complexity with fair prognosis for optimal return to PLOF due to time from onset, current level of daily physically activity, PMH, and PLOF. Pt will benefit from skilled PT 2x/week for 6 weeks to address deficits and promote safe independence with community and home related mobility and ADLs. Pt would benefit from OT for hand splinting and possible referral to main [facility] OP clinical for multidisciplinary treatment PT/OT/SLP.    Personal Factors and Comorbidities  Age;Comorbidity 3+    Examination-Activity Limitations Bathing;Reach Overhead;Stairs;Stand;Dressing;Bed Mobility;Hygiene/Grooming;Lift;Transfers;Carry;Locomotion Level    Examination-Participation Restrictions Occupation    Stability/Clinical Decision Making Evolving/Moderate complexity    Clinical Decision Making Moderate    Rehab Potential Fair    PT Frequency 2x / week    PT Duration 6 weeks    PT Treatment/Interventions ADLs/Self Care Home Management;Biofeedback;Electrical Stimulation;Cryotherapy;Moist Heat;Traction;Ultrasound;DME Instruction;Gait training;Stair training;Functional mobility training;Therapeutic activities;Neuromuscular re-education;Balance training;Therapeutic exercise;Patient/family education;Manual techniques;Wheelchair mobility training;Energy conservation;Dry needling;Passive range of motion;Vestibular;Joint Manipulations;Spinal Manipulations    PT Next Visit Plan REASSES STANDING/GAIT ABILITY, SENSTATION, RUE MMT    PT Home Exercise Plan [email or web address]    Recommended Other Services OT for hand splinting, possible referral to main [facility] outpatient clinic for PT/OT multidisciplinary treatment.    Consulted and Agree with Plan of Care Patient;Family member/caregiver    Family Member Consulted Mother             Patient will benefit from skilled therapeutic intervention in order to improve the following deficits and impairments:  Abnormal gait, Cardiopulmonary status limiting activity, Decreased balance, Decreased activity tolerance, Decreased cognition, Decreased coordination, Decreased endurance, Decreased knowledge of use of DME, Decreased mobility, Decreased safety awareness, Decreased range of motion, Difficulty walking, Decreased strength, Hypomobility, Hypermobility, Increased muscle spasms, Impaired flexibility, Impaired perceived functional ability, Impaired tone, Impaired UE functional use, Postural dysfunction, Improper body mechanics, Pain  Visit  Diagnosis: Hemiplegia and hemiparesis following cerebral infarction affecting left non-dominant side (HCC)  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  Cammie Mcgee, PT, DPT # [id number] Lawernce Ion, SPT 04/19/2021, 7:40 AM  [facility] [address] [place], [place], [address] Phone: [phone]   Fax:  [phone]  Name: Barry Olson MRN: [id number] Date of Birth: 04-29-1976

## 2021-04-20 ENCOUNTER — Other Ambulatory Visit: Payer: Self-pay

## 2021-04-20 ENCOUNTER — Ambulatory Visit: Payer: Medicaid Other | Admitting: Physical Therapy

## 2021-04-20 ENCOUNTER — Encounter: Payer: Self-pay | Admitting: Physical Therapy

## 2021-04-20 DIAGNOSIS — I69354 Hemiplegia and hemiparesis following cerebral infarction affecting left non-dominant side: Secondary | ICD-10-CM

## 2021-04-20 DIAGNOSIS — Z7409 Other reduced mobility: Secondary | ICD-10-CM

## 2021-04-20 DIAGNOSIS — M6281 Muscle weakness (generalized): Secondary | ICD-10-CM

## 2021-04-20 NOTE — Therapy (Signed)
[facility] [address] [place], [place], [address] Phone: [phone]   Fax:  [phone]  Physical Therapy Treatment  Patient Details  Name: Barry Olson MRN: [id number] Date of Birth: Nov 06, 1975 Referring Provider (PT): Marcelino Duster, MD   Encounter Date: 04/20/2021   PT End of Session - 04/20/21 1224     Visit Number 2    Number of Visits 12    Date for PT Re-Evaluation 05/30/21    Authorization - Visit Number 2    Authorization - Number of Visits 10    Progress Note Due on Visit 10    PT Start Time 1110    PT Stop Time 1205    PT Time Calculation (min) 55 min    Equipment Utilized During Treatment Gait belt    Activity Tolerance Patient tolerated treatment well;Patient limited by pain;Patient limited by fatigue;No increased pain;Patient limited by lethargy    Behavior During Therapy [facility] for tasks assessed/performed;Flat affect             Past Medical History:  Diagnosis Date   Back pain     Past Surgical History:  Procedure Laterality Date   HERNIA REPAIR      There were no vitals filed for this visit.   Subjective Assessment - 04/20/21 1221     Subjective Pt presents to tx with moderate L shoulder pain and no LE/low back pain. Pt states good adherence to HEP with increased movement of the elbow and hand. Pt states no falls from last tx.    Pertinent History Strong family support, Pt's mother would like pt to walk again to assist with transfers. Pt would like to return use of LUE. Pt was standing with modified walking when d/c from hospital/rehab last year.    Limitations Walking;Lifting;Standing;House hold activities    How long can you sit comfortably? 5-10 mins    How long can you stand comfortably? Unable due to caregiver fear of falling.    How long can you walk comfortably? Unable    Diagnostic tests x-ray 10/06/20: WNL    Patient Stated Goals Increase use of LUE, Walking and preform transfers  with independence.    Currently in Pain? Other (Comment)    Pain Location Shoulder    Pain Descriptors / Indicators Aching    Pain Type Chronic pain    Pain Onset More than a month ago                Treatment  Reviewed HEP  Neuromuscular Re-Education:   Seated LAQ, hip marching, and heel raises with contact guide of LLE for optimal muscle firing and coordination for pre-gait activity.   //bars:  STS x8 with standing tolerance from 1-3 minutes. Pt requires mod A during STS transfer and CGA-mod A for standing balance. Pt requires R UE assist in // bars. Pt was able to improve standing balance form mod A to CGA. Long airex pad was placed between feet to promote optimal LE alignment, as the pt tends to stand with a narrow base of support and increased WB R>L. Pt request seated rest breaks 2/2 R knee pain. Marching was attempted, however pt underwent loss of balance and required therapist max A to prevent fall.           PT Education - 04/20/21 1223     Education Details Pt was educated on proper standing posture and LE placement.    Person(s) Educated Patient  Methods Explanation;Demonstration;Tactile cues;Verbal cues    Comprehension Need further instruction;Verbalized understanding                 PT Long Term Goals - 04/18/21 1658       PT LONG TERM GOAL #1   Title Pt will increase FOTO score to predicted value of 38 for improvement in self-reported function with ADL    Baseline 12    Time 6    Period Weeks    Status New    Target Date 05/30/21      PT LONG TERM GOAL #2   Title Pt will be Mod I with bed-to-w/c transfers when transfering to the R with sliding board and occasional cueing    Baseline CGA-minA with therapist. max A-TD with mother.    Time 6    Period Weeks    Status New    Target Date 05/30/21      PT LONG TERM GOAL #3   Title Pt will be independent with HEP in order to improve strength and decrease back pain in order to improve  seated tolerance at home    Baseline Pt current states severe LBP during seated posuture of 5-10 mins    Time 6    Period Weeks    Status New    Target Date 05/30/21      PT LONG TERM GOAL #4   Title Pt will tolerate 5 mins of standing in // bars with Min A to promote WB and decreased pressure to post pelvic in seated/supine posture    Baseline deferred to later tx.    Time 6    Period Weeks    Status New    Target Date 05/30/21                   Plan - 04/20/21 1224     Clinical Impression Statement Today's tx was focused on understanding and promoting pt standing tolerance and balance. Pt displayed marked balance deficits and poor  LLE awareness WB tolerance. Pt was able to display in treatment improvement with standing from Mod A to CGA.  Moderate posterior lean in standing while using gait belt in //-bars.  Pt continues to display mark LE/UE endurance, strength, and ROM deficits resulting in decreased safe home/community mobiity, increased pain, and limited access to QOL.  Pt. will continue to benefit from skilled physical therapy to progress POC to address remaining deficits to facilitate maximum functional capacity for optimal personal health and wellness for ADLs.    Personal Factors and Comorbidities Age;Comorbidity 3+    Examination-Activity Limitations Bathing;Reach Overhead;Stairs;Stand;Dressing;Bed Mobility;Hygiene/Grooming;Lift;Transfers;Carry;Locomotion Level    Examination-Participation Restrictions Occupation    Stability/Clinical Decision Making Evolving/Moderate complexity    Clinical Decision Making Moderate    Rehab Potential Fair    PT Frequency 2x / week    PT Duration 6 weeks    PT Treatment/Interventions ADLs/Self Care Home Management;Biofeedback;Electrical Stimulation;Cryotherapy;Moist Heat;Traction;Ultrasound;DME Instruction;Gait training;Stair training;Functional mobility training;Therapeutic activities;Neuromuscular re-education;Balance  training;Therapeutic exercise;Patient/family education;Manual techniques;Wheelchair mobility training;Energy conservation;Dry needling;Passive range of motion;Vestibular;Joint Manipulations;Spinal Manipulations    PT Next Visit Plan Progress standing tolerance and pregait activity    PT Home Exercise Plan [email or web address]    Consulted and Agree with Plan of Care Patient;Family member/caregiver    Family Member Consulted Mother             Patient will benefit from skilled therapeutic intervention in order to improve the following deficits and impairments:  Abnormal gait, Cardiopulmonary status limiting activity, Decreased  balance, Decreased activity tolerance, Decreased cognition, Decreased coordination, Decreased endurance, Decreased knowledge of use of DME, Decreased mobility, Decreased safety awareness, Decreased range of motion, Difficulty walking, Decreased strength, Hypomobility, Hypermobility, Increased muscle spasms, Impaired flexibility, Impaired perceived functional ability, Impaired tone, Impaired UE functional use, Postural dysfunction, Improper body mechanics, Pain  Visit Diagnosis: Hemiplegia and hemiparesis following cerebral infarction affecting left non-dominant side (HCC)  Decreased independence with transfers  Spastic hemiplegia of left nondominant side as late effect of cerebral infarction [facility])  Muscle weakness (generalized)     Problem List There are no problems to display for this patient.  Cammie Mcgee, PT, DPT # [id number] Lawernce Ion, SPT 04/20/2021, 7:35 PM  [place] [facility] [address] [place], [place], [address] Phone: [phone]   Fax:  [phone]  Name: Barry Olson MRN: [id number] Date of Birth: Jul 23, 1976

## 2021-04-25 ENCOUNTER — Encounter: Payer: Medicaid Other | Admitting: Physical Therapy

## 2021-04-27 ENCOUNTER — Encounter: Payer: Medicaid Other | Admitting: Physical Therapy

## 2021-05-02 ENCOUNTER — Ambulatory Visit: Payer: Medicaid Other | Admitting: Physical Therapy

## 2021-05-02 ENCOUNTER — Encounter: Payer: Self-pay | Admitting: Physical Therapy

## 2021-05-02 ENCOUNTER — Other Ambulatory Visit: Payer: Self-pay

## 2021-05-02 DIAGNOSIS — I69354 Hemiplegia and hemiparesis following cerebral infarction affecting left non-dominant side: Secondary | ICD-10-CM | POA: Diagnosis not present

## 2021-05-02 DIAGNOSIS — M6281 Muscle weakness (generalized): Secondary | ICD-10-CM

## 2021-05-02 DIAGNOSIS — Z7409 Other reduced mobility: Secondary | ICD-10-CM

## 2021-05-02 NOTE — Therapy (Signed)
[facility] [address] [place], [place], [address] Phone: [phone]   Fax:  [phone]  Physical Therapy Treatment  Patient Details  Name: Barry Olson MRN: [id number] Date of Birth: [birth year] Referring Provider (PT): Marcelino Duster, MD   Encounter Date: 05/02/2021   PT End of Session - 05/02/21 1234     Visit Number 3    Number of Visits 12    Date for PT Re-Evaluation 05/30/21    Authorization - Visit Number 3    Authorization - Number of Visits 10    Progress Note Due on Visit 10    PT Start Time 1114    PT Stop Time 1201    PT Time Calculation (min) 47 min    Equipment Utilized During Treatment Gait belt    Activity Tolerance Patient tolerated treatment well;Patient limited by pain;Patient limited by fatigue;No increased pain;Patient limited by lethargy    Behavior During Therapy [facility] for tasks assessed/performed;Flat affect             Past Medical History:  Diagnosis Date   Back pain     Past Surgical History:  Procedure Laterality Date   HERNIA REPAIR      There were no vitals filed for this visit.   Subjective Assessment - 05/02/21 1232     Subjective Pt present to tx without complaints of shoulder or knee pain, however, does have moderate low back stiffness. Pt states watching a lot of movies over the weekend with limited time out of bed.    Pertinent History Strong family support, Pt's mother would like pt to walk again to assist with transfers. Pt would like to return use of LUE. Pt was standing with modified walking when d/c from hospital/rehab last year.    Limitations Walking;Lifting;Standing;House hold activities    How long can you sit comfortably? 5-10 mins    How long can you stand comfortably? Unable due to caregiver fear of falling.    How long can you walk comfortably? Unable    Diagnostic tests x-ray 10/06/20: WNL    Patient Stated Goals Increase use of LUE, Walking and preform  transfers with independence.    Currently in Pain? No/denies    Pain Onset More than a month ago              Treatment  Neuromuscular Re-Education:   1) NuStep 6 mins L0 with TD for LUE movement and grip. Verbal and contact cueing to facilitate optimal tissue loading and correct biomechanical alignment to ensure efficient and safe movement.   2) Seated LAQ, hip marching, and heel raises with contact guide of LLE for optimal muscle firing and coordination for pre-gait activity.    //bars:  1) STS x4 with standing tolerance from 1-3 minutes for magnets fishing with RUE. Pt requires mod A during STS transfer and CGA-mod A for standing balance. Pt requires R UE assist in // bars. Pt request seated rest breaks 2/2 R knee pain.   2) Walking in the // bars with extensive verbal cueing and CGA to mod A. RUE was used heavily during mobility    Wheelchair management:  Pt displayed fair-good w/c mobility with R UE+LE and LLE placed on foot rest. Pt was able to display w/c mobility across 300 ft with verbal and contact cueing to ensure LLE remained on rest.        PT Education - 05/02/21 1233     Education Details Pt and  family was educated on w/c mobilty with R UE+LE with LLE on foot rest.    Person(s) Educated Patient;Parent(s);Caregiver(s)    Methods Explanation;Demonstration;Tactile cues;Verbal cues    Comprehension Verbalized understanding;Returned demonstration;Need further instruction                 PT Long Term Goals - 04/18/21 1658       PT LONG TERM GOAL #1   Title Pt will increase FOTO score to predicted value of 38 for improvement in self-reported function with ADL    Baseline 12    Time 6    Period Weeks    Status New    Target Date 05/30/21      PT LONG TERM GOAL #2   Title Pt will be Mod I with bed-to-w/c transfers when transfering to the R with sliding board and occasional cueing    Baseline CGA-minA with therapist. max A-TD with mother.    Time  6    Period Weeks    Status New    Target Date 05/30/21      PT LONG TERM GOAL #3   Title Pt will be independent with HEP in order to improve strength and decrease back pain in order to improve seated tolerance at home    Baseline Pt current states severe LBP during seated posuture of 5-10 mins    Time 6    Period Weeks    Status New    Target Date 05/30/21      PT LONG TERM GOAL #4   Title Pt will tolerate 5 mins of standing in // bars with Min A to promote WB and decreased pressure to post pelvic in seated/supine posture    Baseline deferred to later tx.    Time 6    Period Weeks    Status New    Target Date 05/30/21                   Plan - 05/02/21 1234     Clinical Impression Statement Today's tx was focused on promoting standing duel task balance with magnet fishing and promoting greater mobility with w/c mobility. Pt and family were educated on w/c mobilty with R UE+LE with LLE on foot rest. Pt continues to display mark LE/UE endurance, strength, and ROM deficits resulting in decreased safe home/community mobiity, increased pain, and limited access to QOL. Pt. will continue to benefit from skilled physical therapy to progress POC to address remaining deficits to facilitate maximum functional capacity for optimal personal health and wellness for ADLs.    Personal Factors and Comorbidities Age;Comorbidity 3+    Examination-Activity Limitations Bathing;Reach Overhead;Stairs;Stand;Dressing;Bed Mobility;Hygiene/Grooming;Lift;Transfers;Carry;Locomotion Level    Examination-Participation Restrictions Occupation    Stability/Clinical Decision Making Evolving/Moderate complexity    Clinical Decision Making Moderate    Rehab Potential Fair    PT Frequency 2x / week    PT Duration 6 weeks    PT Treatment/Interventions ADLs/Self Care Home Management;Biofeedback;Electrical Stimulation;Cryotherapy;Moist Heat;Traction;Ultrasound;DME Instruction;Gait training;Stair  training;Functional mobility training;Therapeutic activities;Neuromuscular re-education;Balance training;Therapeutic exercise;Patient/family education;Manual techniques;Wheelchair mobility training;Energy conservation;Dry needling;Passive range of motion;Vestibular;Joint Manipulations;Spinal Manipulations    PT Next Visit Plan reassess w/c mobility.    PT Home Exercise Plan [email or web address]    Consulted and Agree with Plan of Care Patient;Family member/caregiver    Family Member Consulted Mother             Patient will benefit from skilled therapeutic intervention in order to improve the following deficits and impairments:  Abnormal gait, Cardiopulmonary status  limiting activity, Decreased balance, Decreased activity tolerance, Decreased cognition, Decreased coordination, Decreased endurance, Decreased knowledge of use of DME, Decreased mobility, Decreased safety awareness, Decreased range of motion, Difficulty walking, Decreased strength, Hypomobility, Hypermobility, Increased muscle spasms, Impaired flexibility, Impaired perceived functional ability, Impaired tone, Impaired UE functional use, Postural dysfunction, Improper body mechanics, Pain  Visit Diagnosis: Hemiplegia and hemiparesis following cerebral infarction affecting left non-dominant side (HCC)  Decreased independence with transfers  Spastic hemiplegia of left 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  Cammie Mcgee, PT, DPT # [id number] 05/02/2021, 1:09 PM  [facility] [address] [place], [place], [address] Phone: [phone]   Fax:  [phone]  Name: CHUKWUDI EWEN MRN: [id number] Date of Birth: 1975-08-10

## 2021-05-04 ENCOUNTER — Ambulatory Visit: Payer: Medicaid Other | Admitting: Physical Therapy

## 2021-05-04 ENCOUNTER — Other Ambulatory Visit: Payer: Self-pay

## 2021-05-04 ENCOUNTER — Encounter: Payer: Self-pay | Admitting: Physical Therapy

## 2021-05-04 DIAGNOSIS — I69354 Hemiplegia and hemiparesis following cerebral infarction affecting left non-dominant side: Secondary | ICD-10-CM | POA: Diagnosis not present

## 2021-05-04 DIAGNOSIS — Z7409 Other reduced mobility: Secondary | ICD-10-CM

## 2021-05-04 DIAGNOSIS — M6281 Muscle weakness (generalized): Secondary | ICD-10-CM

## 2021-05-04 NOTE — Therapy (Signed)
[facility] [address] [place], [place], [address] Phone: [phone]   Fax:  [phone]  Physical Therapy Treatment  Patient Details  Name: Barry Olson MRN: [id number] Date of Birth: 1976/05/21 Referring Provider (PT): Marcelino Duster, MD   Encounter Date: 05/04/2021   PT End of Session - 05/04/21 1348     Visit Number 4    Number of Visits 12    Date for PT Re-Evaluation 05/30/21    Authorization - Visit Number 4    Authorization - Number of Visits 10    Progress Note Due on Visit 10    PT Start Time 1102    PT Stop Time 1148    PT Time Calculation (min) 46 min    Equipment Utilized During Treatment Gait belt    Activity Tolerance Patient tolerated treatment well;Patient limited by pain;Patient limited by fatigue;No increased pain;Patient limited by lethargy    Behavior During Therapy [facility] for tasks assessed/performed;Flat affect             Past Medical History:  Diagnosis Date   Back pain     Past Surgical History:  Procedure Laterality Date   HERNIA REPAIR      There were no vitals filed for this visit.   Subjective Assessment - 05/04/21 1102     Subjective Pt presents to tx without complaints of shoulder or knee pain, however, does have moderate low back stiffness. Pt reports no independent mobility with w/c. Pt did state that he did do more standing with his mother. Pt BP at the start of tx was 159/99. Pt has not taken BP medication in the last 2 days.    Pertinent History Strong family support, Pt's mother would like pt to walk again to assist with transfers. Pt would like to return use of LUE. Pt was standing with modified walking when d/c from hospital/rehab last year.    Limitations Walking;Lifting;Standing;House hold activities    How long can you sit comfortably? 5-10 mins    How long can you stand comfortably? Unable due to caregiver fear of falling.    How long can you walk comfortably? Unable     Diagnostic tests x-ray 10/06/20: WNL    Patient Stated Goals Increase use of LUE, Walking and preform transfers with independence.    Currently in Pain? No/denies    Pain Onset More than a month ago              reatment  Neuromuscular Re-Education:   1) NuStep 9 mins L2 without UE assist 2/2 to poor grip and control of LUE. PT required contact assist of LE to sure alignment was correct. Pt took a break at 5 mins and 7.5 mins 2/2 clonus of LLE. BP was maintained at 159/99. RPE: low   2) Seated LAQ, hip marching, and heel raises with contact guide of LLE for optimal muscle firing and coordination for pre-gait activity.    //bars:  1) STS x4 with standing tolerance from 1-3 minutes for varying targets for hand touching with RUE. Pt requires mod A during STS transfer and CGA-mod A for standing balance. Pt requires R UE assist in // bars. Pt request seated rest breaks 2/2 R knee pain.      Wheelchair management:  Pt displayed fair-good w/c mobility with R UE+LE and LLE placed on foot rest. Pt was able to display w/c mobility across 500 ft of even ground, uneven side walk, and over door  thresholds.  with verbal and contact cueing to ensure LLE remained on rest. Pt displayed increased difficulty with getting the w/c over a door threshold.    Manual Therapy:  Sustained L elbow ext during seated rest breaks to decrease pain in the LUE. GH assessment displayed marked Ant instability of the [facility] joint  GH displays subluxation during horizontal abd/add.          PT Education - 05/04/21 1347     Education Details Pt was educated on correct outdoor w/c mobility over sidewalk and door thresholds.    Person(s) Educated Patient;Parent(s);Caregiver(s)    Methods Explanation;Demonstration;Tactile cues;Verbal cues    Comprehension Verbalized understanding;Returned demonstration;Need further instruction;Tactile cues required                 PT Long Term Goals - 04/18/21 1658        PT LONG TERM GOAL #1   Title Pt will increase FOTO score to predicted value of 38 for improvement in self-reported function with ADL    Baseline 12    Time 6    Period Weeks    Status New    Target Date 05/30/21      PT LONG TERM GOAL #2   Title Pt will be Mod I with bed-to-w/c transfers when transfering to the R with sliding board and occasional cueing    Baseline CGA-minA with therapist. max A-TD with mother.    Time 6    Period Weeks    Status New    Target Date 05/30/21      PT LONG TERM GOAL #3   Title Pt will be independent with HEP in order to improve strength and decrease back pain in order to improve seated tolerance at home    Baseline Pt current states severe LBP during seated posuture of 5-10 mins    Time 6    Period Weeks    Status New    Target Date 05/30/21      PT LONG TERM GOAL #4   Title Pt will tolerate 5 mins of standing in // bars with Min A to promote WB and decreased pressure to post pelvic in seated/supine posture    Baseline deferred to later tx.    Time 6    Period Weeks    Status New    Target Date 05/30/21                   Plan - 05/04/21 1349     Clinical Impression Statement Today's tx was focused on continued independence with w/c mobility.  Pt was able to display w/c mobility across 500 ft of even ground, uneven side walk, and over door thresholds.  with verbal and contact cueing to ensure LLE remained on rest. Pt displayed increased difficulty with getting the w/c over a door threshold. Activity was limited 2/2 high BP ~160/100. Education was provided to the pt and caregiver of the extreme importance of good adherence to BP medication. GH assessment displayed marked Ant instability of the [facility] joint  GH displays subluxation during horizontal abd/add.  Pt BP and/or L GH does not warrant a referral at this time, but will be closely monitored for possible referral soon. Pt continues to display mark LE/UE endurance, strength, and ROM  deficits resulting in decreased safe home/community mobiity, increased pain, and limited access to QOL. Pt. will continue to benefit from skilled physical therapy to progress POC to address remaining deficits to facilitate maximum functional capacity for optimal  personal health and wellness for ADLs.    Personal Factors and Comorbidities Age;Comorbidity 3+    Examination-Activity Limitations Bathing;Reach Overhead;Stairs;Stand;Dressing;Bed Mobility;Hygiene/Grooming;Lift;Transfers;Carry;Locomotion Level    Examination-Participation Restrictions Occupation    Stability/Clinical Decision Making Evolving/Moderate complexity    Clinical Decision Making Moderate    Rehab Potential Fair    PT Frequency 2x / week    PT Duration 6 weeks    PT Treatment/Interventions ADLs/Self Care Home Management;Biofeedback;Electrical Stimulation;Cryotherapy;Moist Heat;Traction;Ultrasound;DME Instruction;Gait training;Stair training;Functional mobility training;Therapeutic activities;Neuromuscular re-education;Balance training;Therapeutic exercise;Patient/family education;Manual techniques;Wheelchair mobility training;Energy conservation;Dry needling;Passive range of motion;Vestibular;Joint Manipulations;Spinal Manipulations    PT Next Visit Plan reassess BP and medication adherence.    PT Home Exercise Plan [email or web address]    Consulted and Agree with Plan of Care Patient;Family member/caregiver    Family Member Consulted Mother             Patient will benefit from skilled therapeutic intervention in order to improve the following deficits and impairments:  Abnormal gait, Cardiopulmonary status limiting activity, Decreased balance, Decreased activity tolerance, Decreased cognition, Decreased coordination, Decreased endurance, Decreased knowledge of use of DME, Decreased mobility, Decreased safety awareness, Decreased range of motion, Difficulty walking, Decreased strength, Hypomobility, Hypermobility, Increased muscle spasms,  Impaired flexibility, Impaired perceived functional ability, Impaired tone, Impaired UE functional use, Postural dysfunction, Improper body mechanics, Pain  Visit Diagnosis: Hemiplegia and hemiparesis following cerebral infarction affecting left non-dominant side (HCC)  Decreased independence with transfers  Muscle weakness (generalized)  Spastic hemiplegia of left nondominant side as late effect of cerebral infarction [facility])     Problem List There are no problems to display for this patient.  Cammie Mcgee, PT, DPT # [id number] Lawernce Ion, SPT 05/04/2021, 3:03 PM  [facility] [address] [place], [place], [address] Phone: [phone]   Fax:  [phone]  Name: Barry Olson MRN: [id number] Date of Birth: 1976/02/20

## 2021-05-09 ENCOUNTER — Other Ambulatory Visit: Payer: Self-pay

## 2021-05-09 ENCOUNTER — Encounter: Payer: Self-pay | Admitting: Physical Therapy

## 2021-05-09 ENCOUNTER — Ambulatory Visit: Payer: Medicaid Other | Attending: Internal Medicine | Admitting: Physical Therapy

## 2021-05-09 ENCOUNTER — Ambulatory Visit: Payer: Medicaid Other

## 2021-05-09 DIAGNOSIS — I69354 Hemiplegia and hemiparesis following cerebral infarction affecting left non-dominant side: Secondary | ICD-10-CM | POA: Diagnosis not present

## 2021-05-09 DIAGNOSIS — Z7409 Other reduced mobility: Secondary | ICD-10-CM | POA: Insufficient documentation

## 2021-05-09 DIAGNOSIS — M6281 Muscle weakness (generalized): Secondary | ICD-10-CM | POA: Insufficient documentation

## 2021-05-09 NOTE — Therapy (Signed)
[facility] [address] [place], [place], [address] Phone: [phone]   Fax:  [phone]  Physical Therapy Treatment  Patient Details  Name: Barry Olson MRN: [id number] Date of Birth: Mar 18, 1976 Referring Provider (PT): Marcelino Duster, MD   Encounter Date: 05/09/2021   PT End of Session - 05/09/21 1214     Visit Number 5    Number of Visits 12    Date for PT Re-Evaluation 05/30/21    Authorization - Visit Number 5    Authorization - Number of Visits 10    Progress Note Due on Visit 10    PT Start Time 1106    PT Stop Time 1159    PT Time Calculation (min) 53 min    Equipment Utilized During Treatment Gait belt    Activity Tolerance Patient tolerated treatment well;Patient limited by pain;Patient limited by fatigue;No increased pain;Patient limited by lethargy    Behavior During Therapy [facility] for tasks assessed/performed;Flat affect             Past Medical History:  Diagnosis Date   Back pain     Past Surgical History:  Procedure Laterality Date   HERNIA REPAIR      There were no vitals filed for this visit.   Subjective Assessment - 05/09/21 1209     Subjective Pt presents to tx without complaints of pain in the shoulder, back or knee. Pt and mother states that have been spending more time in standing next to the bed with assistance of hand rail. Pt BP at the start of tx was 117/88.    Pertinent History Strong family support, Pt's mother would like pt to walk again to assist with transfers. Pt would like to return use of LUE. Pt was standing with modified walking when d/c from hospital/rehab last year.    Limitations Walking;Lifting;Standing;House hold activities    How long can you sit comfortably? 5-10 mins    How long can you stand comfortably? Unable due to caregiver fear of falling.    How long can you walk comfortably? Unable    Diagnostic tests x-ray 10/06/20: WNL    Patient Stated Goals Increase use  of LUE, Walking and preform transfers with independence.    Currently in Pain? No/denies    Pain Onset More than a month ago               Neuromuscular Re-Education:  Seated LAQ, hip marching, and heel raises with contact guide of LLE for optimal muscle firing and coordination for pre-gait activity.   //bars:  1) STS x4 with standing tolerance from 1-3 minutes for varying targets for hand touching with RUE. Pt requires mod A during STS transfer and CGA-mod A for standing balance. Pt requires R UE assist in // bars. Pt request seated rest breaks 2/2 R knee pain.   2) Seated LAQ, hip marching, and heel raises with contact guide of LLE for optimal muscle firing and coordination for pre-gait activity.    3) walking // bars distance x6 with CGA-mod A with gait belt donned to eliminate fall risk. Verbal and contact cueing to facilitate optimal tissue loading and correct biomechanical alignment to ensure efficient and safe movement.    Wheelchair management:  Pt displayed fair-good w/c mobility with R UE+LE and LLE placed on foot rest. Pt was able to display w/c mobility across 2000 ft of even ground, uneven side walk, and over door thresholds.  with verbal and contact  cueing to ensure LLE remained on rest. Pt displayed increased difficulty with getting the w/c over a door threshold. Pt was able to improve mobility compared to last time with threshold clearance with verbal cueing only.    Manual Therapy:  Sustained L elbow ext with supination and wrist ext during seated rest breaks to decrease pain in the LUE. 11 mins. Verbal cueing to ensure mobility intensity within a pt tolerated range.        PT Long Term Goals - 04/18/21 1658       PT LONG TERM GOAL #1   Title Pt will increase FOTO score to predicted value of 38 for improvement in self-reported function with ADL    Baseline 12    Time 6    Period Weeks    Status New    Target Date 05/30/21      PT LONG TERM GOAL #2    Title Pt will be Mod I with bed-to-w/c transfers when transfering to the R with sliding board and occasional cueing    Baseline CGA-minA with therapist. max A-TD with mother.    Time 6    Period Weeks    Status New    Target Date 05/30/21      PT LONG TERM GOAL #3   Title Pt will be independent with HEP in order to improve strength and decrease back pain in order to improve seated tolerance at home    Baseline Pt current states severe LBP during seated posuture of 5-10 mins    Time 6    Period Weeks    Status New    Target Date 05/30/21      PT LONG TERM GOAL #4   Title Pt will tolerate 5 mins of standing in // bars with Min A to promote WB and decreased pressure to post pelvic in seated/supine posture    Baseline deferred to later tx.    Time 6    Period Weeks    Status New    Target Date 05/30/21                   Plan - 05/09/21 1215     Clinical Impression Statement Pt displayed improved w/c mobility with greater distance and less contact cueing for traversing door threshold. Pt continues to require CGA-mod A for amb in the // bars and is currently not ready for walker or cane. Pt continues to display mark LE/UE endurance, strength, and ROM deficits resulting in decreased safe home/community mobiity, increased pain, and limited access to QOL. Pt. will continue to benefit from skilled physical therapy to progress POC to address remaining deficits to facilitate maximum functional capacity for optimal personal health and wellness for ADLs.    Personal Factors and Comorbidities Age;Comorbidity 3+    Examination-Activity Limitations Bathing;Reach Overhead;Stairs;Stand;Dressing;Bed Mobility;Hygiene/Grooming;Lift;Transfers;Carry;Locomotion Level    Examination-Participation Restrictions Occupation    Stability/Clinical Decision Making Evolving/Moderate complexity    Clinical Decision Making Moderate    Rehab Potential Fair    PT Frequency 2x / week    PT Duration 6 weeks     PT Treatment/Interventions ADLs/Self Care Home Management;Biofeedback;Electrical Stimulation;Cryotherapy;Moist Heat;Traction;Ultrasound;DME Instruction;Gait training;Stair training;Functional mobility training;Therapeutic activities;Neuromuscular re-education;Balance training;Therapeutic exercise;Patient/family education;Manual techniques;Wheelchair mobility training;Energy conservation;Dry needling;Passive range of motion;Vestibular;Joint Manipulations;Spinal Manipulations    PT Next Visit Plan continued outdoor w/ mobility.   ISSUE caregiver letter to pts. mother.    PT Home Exercise Plan [email or web address]    Consulted and Agree with Plan of Care Patient;Family member/caregiver  Family Member Consulted Mother             Patient will benefit from skilled therapeutic intervention in order to improve the following deficits and impairments:  Abnormal gait, Cardiopulmonary status limiting activity, Decreased balance, Decreased activity tolerance, Decreased cognition, Decreased coordination, Decreased endurance, Decreased knowledge of use of DME, Decreased mobility, Decreased safety awareness, Decreased range of motion, Difficulty walking, Decreased strength, Hypomobility, Hypermobility, Increased muscle spasms, Impaired flexibility, Impaired perceived functional ability, Impaired tone, Impaired UE functional use, Postural dysfunction, Improper body mechanics, Pain  Visit Diagnosis: Hemiplegia and hemiparesis following cerebral infarction affecting left non-dominant side (HCC)  Decreased independence with transfers  Muscle weakness (generalized)  Spastic hemiplegia of left nondominant side as late effect of cerebral infarction [facility])     Problem List There are no problems to display for this patient.  Cammie Mcgee, PT, DPT # [id number] Lawernce Ion, SPT 05/09/2021, 3:15 PM  [facility] [address] [place], [place], [address] Phone: [phone]    Fax:  [phone]  Name: Barry Olson MRN: [id number] Date of Birth: 1975/08/19

## 2021-05-12 ENCOUNTER — Encounter: Payer: Medicaid Other | Admitting: Physical Therapy

## 2021-05-16 ENCOUNTER — Ambulatory Visit: Payer: Medicaid Other | Admitting: Physical Therapy

## 2021-05-16 ENCOUNTER — Encounter: Payer: Self-pay | Admitting: Physical Therapy

## 2021-05-16 ENCOUNTER — Other Ambulatory Visit: Payer: Self-pay

## 2021-05-16 DIAGNOSIS — M6281 Muscle weakness (generalized): Secondary | ICD-10-CM

## 2021-05-16 DIAGNOSIS — I69354 Hemiplegia and hemiparesis following cerebral infarction affecting left non-dominant side: Secondary | ICD-10-CM

## 2021-05-16 DIAGNOSIS — Z7409 Other reduced mobility: Secondary | ICD-10-CM

## 2021-05-16 NOTE — Therapy (Signed)
[facility] [address] [place], [place], [address] Phone: [phone]   Fax:  [phone]  Physical Therapy Treatment  Patient Details  Name: Barry Olson MRN: [id number] Date of Birth: November 12, 1975 Referring Provider (PT): Marcelino Duster, MD   Encounter Date: 05/16/2021   PT End of Session - 05/16/21 1114     Visit Number 6    Number of Visits 12    Date for PT Re-Evaluation 05/30/21    Authorization - Visit Number 6    Authorization - Number of Visits 10    Progress Note Due on Visit 10    PT Start Time 1104    PT Stop Time 1151    PT Time Calculation (min) 47 min    Equipment Utilized During Treatment Gait belt    Activity Tolerance Patient tolerated treatment well;Patient limited by pain;Patient limited by fatigue;No increased pain;Patient limited by lethargy    Behavior During Therapy [facility] for tasks assessed/performed;Flat affect             Past Medical History:  Diagnosis Date   Back pain     Past Surgical History:  Procedure Laterality Date   HERNIA REPAIR      There were no vitals filed for this visit.   Subjective Assessment - 05/16/21 1111     Subjective Pt present to tx without complaints of pain and reports of increase mobility in the w/c at home and greater standing tolerance next to bed. Pt's mother would like letter of caregiver necessity for paper workout purpose.  PT gave letter to pt. after tx. session.    Pertinent History Strong family support, Pt's mother would like pt to walk again to assist with transfers. Pt would like to return use of LUE. Pt was standing with modified walking when d/c from hospital/rehab last year.    Limitations Walking;Lifting;Standing;House hold activities    How long can you sit comfortably? 5-10 mins    How long can you stand comfortably? Unable due to caregiver fear of falling.    How long can you walk comfortably? Unable    Diagnostic tests x-ray 10/06/20: WNL     Patient Stated Goals Increase use of LUE, Walking and preform transfers with independence.    Currently in Pain? No/denies    Pain Onset More than a month ago               Neuromuscular Re-Education:  Seated LAQ, hip marching, and heel raises with contact guide of LLE for optimal muscle firing and coordination for pre-gait activity.   NuStep 10 mins L2 without UE assist 2/2 to poor grip and control of LUE. PT required contact assist of LE to sure alignment was correct.    //bars:  1) STS x4 with standing tolerance from 1-3 minutes for varying targets for hand touching with RUE. Pt requires mod A during STS transfer and CGA-mod A for standing balance. Pt requires R UE assist in // bars. Pt request seated rest breaks 2/2 R knee pain.      2) walking // bars distance x6 with CGA-mod A with gait belt donned to eliminate fall risk. Verbal and contact cueing to facilitate optimal tissue loading and correct biomechanical alignment to ensure efficient and safe movement.   3) Alternating toe taps to a 6'' step 3x10 each leg. CGA-mod A to prevent fall risk. L knee blocked during R step in case of quad fatigue and rapid knee buckling  Wheelchair management:  Pt displayed fair-good w/c mobility with R UE and bilat LE. Pt was able to display w/c mobility across 200 ft of even ground, uneven side walk, and over door thresholds. Pt displayed increased difficulty with bilat LE assist compared to R LE assist 2/2 LLE getting stuck under the chair.  with verbal and contact cueing to ensure LLE remained on rest. Pt displayed increased difficulty with getting the w/c over a door threshold. Pt was able to improve mobility compared to last time with threshold clearance with verbal cueing only.    Manual Therapy:  Sustained L elbow ext with supination and wrist ext during seated rest breaks to decrease pain in the LUE. 11 mins. Verbal cueing to ensure mobility intensity within a pt tolerated  range.         PT Long Term Goals - 04/18/21 1658       PT LONG TERM GOAL #1   Title Pt will increase FOTO score to predicted value of 38 for improvement in self-reported function with ADL    Baseline 12    Time 6    Period Weeks    Status New    Target Date 05/30/21      PT LONG TERM GOAL #2   Title Pt will be Mod I with bed-to-w/c transfers when transfering to the R with sliding board and occasional cueing    Baseline CGA-minA with therapist. max A-TD with mother.    Time 6    Period Weeks    Status New    Target Date 05/30/21      PT LONG TERM GOAL #3   Title Pt will be independent with HEP in order to improve strength and decrease back pain in order to improve seated tolerance at home    Baseline Pt current states severe LBP during seated posuture of 5-10 mins    Time 6    Period Weeks    Status New    Target Date 05/30/21      PT LONG TERM GOAL #4   Title Pt will tolerate 5 mins of standing in // bars with Min A to promote WB and decreased pressure to post pelvic in seated/supine posture    Baseline deferred to later tx.    Time 6    Period Weeks    Status New    Target Date 05/30/21                   Plan - 05/16/21 1115     Clinical Impression Statement Pt displays improved LE coordination and standing tolerance with 6'' alternating toe taps. Pt displayed decreased w/c mobility compared to last time 2/2 no L foot rest, resulting in the L foot falling behind. Pt continues to display mark LE/UE endurance, strength, and ROM deficits resulting in decreased safe home/community mobiity, increased pain, and limited access to QOL. Pt. will continue to benefit from skilled physical therapy to progress POC to address remaining deficits to facilitate maximum functional capacity for optimal personal health and wellness for ADLs    Personal Factors and Comorbidities Age;Comorbidity 3+    Examination-Activity Limitations Bathing;Reach  Overhead;Stairs;Stand;Dressing;Bed Mobility;Hygiene/Grooming;Lift;Transfers;Carry;Locomotion Level    Examination-Participation Restrictions Occupation    Stability/Clinical Decision Making Evolving/Moderate complexity    Clinical Decision Making Moderate    Rehab Potential Fair    PT Frequency 2x / week    PT Duration 6 weeks    PT Treatment/Interventions ADLs/Self Care Home Management;Biofeedback;Electrical Stimulation;Cryotherapy;Moist Heat;Traction;Ultrasound;DME Instruction;Gait training;Stair training;Functional  mobility training;Therapeutic activities;Neuromuscular re-education;Balance training;Therapeutic exercise;Patient/family education;Manual techniques;Wheelchair mobility training;Energy conservation;Dry needling;Passive range of motion;Vestibular;Joint Manipulations;Spinal Manipulations    PT Next Visit Plan Increase standing tolerance    PT Home Exercise Plan [email or web address]    Consulted and Agree with Plan of Care Patient;Family member/caregiver    Family Member Consulted Mother             Patient will benefit from skilled therapeutic intervention in order to improve the following deficits and impairments:  Abnormal gait, Cardiopulmonary status limiting activity, Decreased balance, Decreased activity tolerance, Decreased cognition, Decreased coordination, Decreased endurance, Decreased knowledge of use of DME, Decreased mobility, Decreased safety awareness, Decreased range of motion, Difficulty walking, Decreased strength, Hypomobility, Hypermobility, Increased muscle spasms, Impaired flexibility, Impaired perceived functional ability, Impaired tone, Impaired UE functional use, Postural dysfunction, Improper body mechanics, Pain  Visit Diagnosis: Hemiplegia and hemiparesis following cerebral infarction affecting left non-dominant side (HCC)  Muscle weakness (generalized)  Decreased independence with transfers  Spastic hemiplegia of left nondominant side as late effect of cerebral  infarction [facility])     Problem List There are no problems to display for this patient.  Cammie Mcgee, PT, DPT # [id number] Lawernce Ion, SPT 05/17/2021, 7:49 AM  [facility] [address] [place], [place], [address] Phone: [phone]   Fax:  [phone]  Name: Barry Olson MRN: [id number] Date of Birth: 01/04/1976

## 2021-05-18 ENCOUNTER — Encounter: Payer: Self-pay | Admitting: Physical Therapy

## 2021-05-18 ENCOUNTER — Ambulatory Visit: Payer: Medicaid Other | Admitting: Physical Therapy

## 2021-05-18 ENCOUNTER — Other Ambulatory Visit: Payer: Self-pay

## 2021-05-18 DIAGNOSIS — Z7409 Other reduced mobility: Secondary | ICD-10-CM

## 2021-05-18 DIAGNOSIS — I69354 Hemiplegia and hemiparesis following cerebral infarction affecting left non-dominant side: Secondary | ICD-10-CM

## 2021-05-18 DIAGNOSIS — M6281 Muscle weakness (generalized): Secondary | ICD-10-CM

## 2021-05-19 NOTE — Therapy (Signed)
[facility] [address] [place], [place], [address] Phone: [phone]   Fax:  [phone]  Physical Therapy Treatment  Patient Details  Name: Barry Olson MRN: [id number] Date of Birth: 11-May-1976 Referring Provider (PT): Marcelino Duster, MD   Encounter Date: 05/18/2021   PT End of Session - 05/18/21 1120     Visit Number 7    Number of Visits 12    Date for PT Re-Evaluation 05/30/21    Authorization - Visit Number 7    Authorization - Number of Visits 10    Progress Note Due on Visit 10    PT Start Time 1108    PT Stop Time 1157    PT Time Calculation (min) 49 min    Equipment Utilized During Treatment Gait belt    Activity Tolerance Patient tolerated treatment well;Patient limited by pain;Patient limited by fatigue;No increased pain;Patient limited by lethargy    Behavior During Therapy [facility] for tasks assessed/performed;Flat affect             Past Medical History:  Diagnosis Date   Back pain     Past Surgical History:  Procedure Laterality Date   HERNIA REPAIR      There were no vitals filed for this visit.   Subjective Assessment - 05/18/21 1115     Subjective Pt present to tx without further complaints of LBP 2/2 increased standing activity. Pt states occ L UE pain when in a prolonged position.    Pertinent History Strong family support, Pt's mother would like pt to walk again to assist with transfers. Pt would like to return use of LUE. Pt was standing with modified walking when d/c from hospital/rehab last year.    Limitations Walking;Lifting;Standing;House hold activities    How long can you sit comfortably? 5-10 mins    How long can you stand comfortably? Unable due to caregiver fear of falling.    How long can you walk comfortably? Unable    Diagnostic tests x-ray 10/06/20: WNL    Patient Stated Goals Increase use of LUE, Walking and preform transfers with independence.    Currently in Pain?  No/denies    Pain Onset More than a month ago              Neuromuscular Re-Education:    NuStep 10 mins L4 without UE assist 2/2 to poor grip and control of LUE. PT required contact assist of LE to sure alignment was correct.   //bars:  1) STS x4 with standing tolerance from 1-3 minutes for varying targets for hand touching with RUE. Pt requires mod A during STS transfer and CGA-mod A for standing balance. Pt requires R UE assist in // bars. Pt request seated rest breaks 2/2 R knee pain.    2) walking // bars distance x6 with CGA-mod A with gait belt donned to eliminate fall risk. Verbal and contact cueing to facilitate optimal tissue loading and correct biomechanical alignment to ensure efficient and safe movement.   3) Alternating toe taps to a 6'' step 3x10 each leg. CGA-mod A to prevent fall risk. L knee blocked during R step in case of quad fatigue and rapid knee buckling    Wheelchair management:  Pt displayed good w/c mobility with R UE and R LE. Pt was able to display w/c mobility across 30 ft of even ground, uneven side walk, and over door thresholds.  with verbal and contact cueing to ensure LLE remained on rest.  Pt displayed improved abilities  with getting the w/c over a door threshold. Pt displays increased difficultly controlling speed on a down pitch and gaining movement on a upward pitch.    Manual Therapy:  Sustained L elbow ext with supination and wrist ext during seated rest breaks to decrease pain in the LUE. 9 mins. Verbal cueing to ensure mobility intensity within a pt tolerated range.          PT Long Term Goals - 04/18/21 1658       PT LONG TERM GOAL #1   Title Pt will increase FOTO score to predicted value of 38 for improvement in self-reported function with ADL    Baseline 12    Time 6    Period Weeks    Status New    Target Date 05/30/21      PT LONG TERM GOAL #2   Title Pt will be Mod I with bed-to-w/c transfers when transfering to the R  with sliding board and occasional cueing    Baseline CGA-minA with therapist. max A-TD with mother.    Time 6    Period Weeks    Status New    Target Date 05/30/21      PT LONG TERM GOAL #3   Title Pt will be independent with HEP in order to improve strength and decrease back pain in order to improve seated tolerance at home    Baseline Pt current states severe LBP during seated posuture of 5-10 mins    Time 6    Period Weeks    Status New    Target Date 05/30/21      PT LONG TERM GOAL #4   Title Pt will tolerate 5 mins of standing in // bars with Min A to promote WB and decreased pressure to post pelvic in seated/supine posture    Baseline deferred to later tx.    Time 6    Period Weeks    Status New    Target Date 05/30/21                   Plan - 05/19/21 0720     Clinical Impression Statement Pt displayed increased functional ability during today's tx 2/2 increased NuStep loading, w/c distance, and greater symmetrical movement during toe taps. Pt displayed greater clonus during WB at the fore foot of the LLE. Pt continues to display mark LE/UE endurance, strength, and ROM deficits resulting in decreased safe home/community mobiity, increased pain, and limited access to QOL. Pt. will continue to benefit from skilled physical therapy to progress POC to address remaining deficits to facilitate maximum functional capacity for optimal personal health and wellness for ADLs.    Personal Factors and Comorbidities Age;Comorbidity 3+    Examination-Activity Limitations Bathing;Reach Overhead;Stairs;Stand;Dressing;Bed Mobility;Hygiene/Grooming;Lift;Transfers;Carry;Locomotion Level    Examination-Participation Restrictions Occupation    Stability/Clinical Decision Making Evolving/Moderate complexity    Clinical Decision Making Moderate    Rehab Potential Fair    PT Frequency 2x / week    PT Duration 6 weeks    PT Treatment/Interventions ADLs/Self Care Home  Management;Biofeedback;Electrical Stimulation;Cryotherapy;Moist Heat;Traction;Ultrasound;DME Instruction;Gait training;Stair training;Functional mobility training;Therapeutic activities;Neuromuscular re-education;Balance training;Therapeutic exercise;Patient/family education;Manual techniques;Wheelchair mobility training;Energy conservation;Dry needling;Passive range of motion;Vestibular;Joint Manipulations;Spinal Manipulations    PT Next Visit Plan Possible quad cane gait in // bars.    PT Home Exercise Plan [email or web address]    Consulted and Agree with Plan of Care Patient;Family member/caregiver    Family Member Consulted Mother  Patient will benefit from skilled therapeutic intervention in order to improve the following deficits and impairments:  Abnormal gait, Cardiopulmonary status limiting activity, Decreased balance, Decreased activity tolerance, Decreased cognition, Decreased coordination, Decreased endurance, Decreased knowledge of use of DME, Decreased mobility, Decreased safety awareness, Decreased range of motion, Difficulty walking, Decreased strength, Hypomobility, Hypermobility, Increased muscle spasms, Impaired flexibility, Impaired perceived functional ability, Impaired tone, Impaired UE functional use, Postural dysfunction, Improper body mechanics, Pain  Visit Diagnosis: Hemiplegia and hemiparesis following cerebral infarction affecting left non-dominant side (HCC)  Muscle weakness (generalized)  Decreased independence with transfers  Spastic hemiplegia of left 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  Cammie Mcgee, PT, DPT # [id number] Lawernce Ion, SPT 05/19/2021, 7:53 AM  [facility] [address] [place], [place], [address] Phone: [phone]   Fax:  [phone]  Name: Barry Olson MRN: [id number] Date of Birth:  1975/08/24

## 2021-05-23 ENCOUNTER — Encounter: Payer: Medicaid Other | Admitting: Physical Therapy

## 2021-05-25 ENCOUNTER — Encounter: Payer: Medicaid Other | Admitting: Physical Therapy

## 2021-05-30 ENCOUNTER — Encounter: Payer: Self-pay | Admitting: Physical Therapy

## 2021-05-30 ENCOUNTER — Ambulatory Visit: Payer: Medicaid Other | Admitting: Physical Therapy

## 2021-05-30 ENCOUNTER — Other Ambulatory Visit: Payer: Self-pay

## 2021-05-30 DIAGNOSIS — M6281 Muscle weakness (generalized): Secondary | ICD-10-CM

## 2021-05-30 DIAGNOSIS — I69354 Hemiplegia and hemiparesis following cerebral infarction affecting left non-dominant side: Secondary | ICD-10-CM

## 2021-05-30 DIAGNOSIS — Z7409 Other reduced mobility: Secondary | ICD-10-CM

## 2021-05-30 NOTE — Therapy (Addendum)
[facility] [address] [place], [place], [address] Phone: [phone]   Fax:  [phone]  Physical Therapy Treatment and Reassessment 04/18/2021-05/30/2021  Patient Details  Name: Barry Olson MRN: [id number] Date of Birth: 1976-03-09 Referring Provider (PT): Harrel Lemon, MD   Encounter Date: 05/30/2021   PT End of Session - 05/30/21 1151     Visit Number 8    Number of Visits 24    Date for PT Re-Evaluation 07/25/21    Authorization - Visit Number 8    Authorization - Number of Visits 10    Progress Note Due on Visit 10    PT Start Time 1101    PT Stop Time 1148    PT Time Calculation (min) 47 min    Equipment Utilized During Treatment Gait belt    Activity Tolerance Patient tolerated treatment well;Patient limited by pain;Patient limited by fatigue;No increased pain;Patient limited by lethargy    Behavior During Therapy [facility] for tasks assessed/performed;Flat affect             Past Medical History:  Diagnosis Date   Back pain     Past Surgical History:  Procedure Laterality Date   HERNIA REPAIR      There were no vitals filed for this visit.   Subjective Assessment - 05/30/21 1148     Subjective Pt presents to tx without complaints of any pain. Pt states that he was able to ride in the car and w/c for extended period of time this weekend to visit the flea market. Pt states that he we able to get transfer from the w/c to bed without assistance.    Pertinent History Strong family support, Pt's mother would like pt to walk again to assist with transfers. Pt would like to return use of LUE. Pt was standing with modified walking when d/c from hospital/rehab last year.    Limitations Walking;Lifting;Standing;House hold activities    How long can you sit comfortably? 5-10 mins    How long can you stand comfortably? Unable due to caregiver fear of falling.    How long can you walk comfortably? Unable     Diagnostic tests x-ray 10/06/20: WNL    Patient Stated Goals Increase use of LUE, Walking and preform transfers with independence.    Currently in Pain? No/denies    Pain Onset More than a month ago                [facility] PT Assessment - 05/31/21 0001       Assessment   Medical Diagnosis left hemiparesis    Referring Provider (PT) Harrel Lemon, MD    Onset Date/Surgical Date 05/29/20    Hand Dominance Right    Prior Therapy Hositpal/inpatient,      Precautions   Precautions None      Home Environment   Living Environment Private residence    Living Arrangements Parent      Prior Function   Level of Independence Needs assistance with ADLs    Vocation On disability    Leisure Fishing-unable             Treatment and reassessment.      FOTO: 34  Transfers: Pt was able to complete w/c-to-bed and bed-to-w/c with occ verbal cueing and CGA assist without sliding board  HEP/LBP for greater sitting tolerance: Pt states that he is currently able to sit WNL without any LBP  Standing tolerance: 75mns. Pt displayed marked RLE fatigue  with visable quad shaking.   New amb goal added: Pt is currently able to amb 20 Ft in the // bars with Mod A and extensive verbal and contact cueing to ensure safe mobility.      PT Education - 05/30/21 1151     Education Details Pt was educated on updated goals and currently POC.    Person(s) Educated Patient    Methods Explanation    Comprehension Verbalized understanding                 PT Long Term Goals - 05/30/21 1121       PT LONG TERM GOAL #1   Title Pt will increase FOTO score to predicted value of 38 for improvement in self-reported function with ADL    Baseline 12 10/14: 34    Time 6    Period Weeks    Status Partially Met    Target Date 07/25/21      PT LONG TERM GOAL #2   Title Pt will be Mod I with bed-to-w/c transfers when transfering to the R with sliding board and occasional cueing    Baseline  CGA-minA with therapist. max A-TD with mother. 10/24: Pt was able to complete w/c-to-bed and bed-to-w/c with occ verbal cueing and CGA assist without sliding board    Time 6    Period Weeks    Status Partially Met    Target Date 07/25/21      PT LONG TERM GOAL #3   Title Pt will be independent with HEP in order to improve strength and decrease back pain in order to improve seated tolerance at home    Baseline Pt current states severe LBP during seated posuture of 5-10 mins 10/24: Pt states that he is currently able to sit WNL without any LBP    Time 6    Period Weeks    Status Achieved    Target Date 05/30/21      PT LONG TERM GOAL #4   Title Pt will tolerate 5 mins of standing in // bars with Min A to promote WB and decreased pressure to post pelvic in seated/supine posture    Baseline deferred to later tx. 10/24: 60mns. Pt displayed marked RLE fatigue with visable quad shaking.    Time 6    Period Weeks    Status Partially Met    Target Date 07/25/21      PT LONG TERM GOAL #5   Title Pt will amb 30 ft with FWW and Mod A with a gait belt donned for greater access to functional mobility in the home.    Baseline Pt is currently able to amb 20 Ft in the // bars with Mod A and extensive verbal and contact cueing to ensure safe mobility.    Time 6    Period Weeks    Status New    Target Date 07/25/21                   Plan - 05/30/21 1152     Clinical Impression Statement Pt was reassessed on this date 05/30/2021. Pt has displayed excellent progression with all goals. Pt reassessment displayed the following findings: FOTO: 34. Transfers: Pt was able to complete w/c-to-bed and bed-to-w/c with occ verbal cueing and CGA assist without sliding board. HEP/LBP for greater sitting tolerance: Pt states that he is currently able to sit WNL without any LBP. Standing tolerance: 452ms. Pt displayed marked RLE fatigue with visable quad shaking.  New  amb goal added: Pt is currently able to  amb 20 Ft in the // bars with Mod A and extensive verbal and contact cueing to ensure safe mobility. Today was only the 8th of 12 planned visit 2/2 to pt transportation issues. Pt continues to display mark LLE/LUE endurance, strength, and ROM deficits resulting in decreased safe home/community mobiity, increased pain, and limited access to QOL. Pt. will continue to benefit from skilled physical therapy for additional 6 weeks 2/xweek to progress POC to address remaining deficits to facilitate maximum functional capacity for optimal personal health and wellness for ADLs.    Personal Factors and Comorbidities Age;Comorbidity 3+    Examination-Activity Limitations Bathing;Reach Overhead;Stairs;Stand;Dressing;Bed Mobility;Hygiene/Grooming;Lift;Transfers;Carry;Locomotion Level    Examination-Participation Restrictions Occupation    Stability/Clinical Decision Making Evolving/Moderate complexity    Clinical Decision Making Moderate    Rehab Potential Fair    PT Frequency 2x / week    PT Duration 8 weeks    PT Treatment/Interventions ADLs/Self Care Home Management;Biofeedback;Electrical Stimulation;Cryotherapy;Moist Heat;Traction;Ultrasound;DME Instruction;Gait training;Stair training;Functional mobility training;Therapeutic activities;Neuromuscular re-education;Balance training;Therapeutic exercise;Patient/family education;Manual techniques;Wheelchair mobility training;Energy conservation;Dry needling;Passive range of motion;Vestibular;Joint Manipulations;Spinal Manipulations    PT Next Visit Plan Assesss gait with FWW in // bars.    PT Home Exercise Plan [id number]    Consulted and Agree with Plan of Care Patient;Family member/caregiver    Family Member Consulted Mother             Patient will benefit from skilled therapeutic intervention in order to improve the following deficits and impairments:  Abnormal gait, Cardiopulmonary status limiting activity, Decreased balance, Decreased activity tolerance,  Decreased cognition, Decreased coordination, Decreased endurance, Decreased knowledge of use of DME, Decreased mobility, Decreased safety awareness, Decreased range of motion, Difficulty walking, Decreased strength, Hypomobility, Hypermobility, Increased muscle spasms, Impaired flexibility, Impaired perceived functional ability, Impaired tone, Impaired UE functional use, Postural dysfunction, Improper body mechanics, Pain  Visit Diagnosis: Hemiplegia and hemiparesis following cerebral infarction affecting left non-dominant side (HCC)  Muscle weakness (generalized)  Decreased independence with transfers  Spastic hemiplegia of left nondominant side as late effect of cerebral infarction [facility])     Problem List There are no problems to display for this patient.  Pura Spice, PT, DPT # [id number] Fara Olden, SPT 05/31/2021, 10:16 AM  [facility] [address] [place], [place], [address] Phone: [phone]   Fax:  [phone]  Name: NIKLAUS MAMARIL MRN: [id number] Date of Birth: 05-Jul-1976

## 2021-05-31 NOTE — Addendum Note (Signed)
Addended by: Cammie Mcgee on: 05/31/2021 10:18 AM   Modules accepted: Orders

## 2021-06-01 ENCOUNTER — Encounter: Payer: Medicaid Other | Admitting: Physical Therapy

## 2021-06-06 ENCOUNTER — Other Ambulatory Visit: Payer: Self-pay

## 2021-06-06 ENCOUNTER — Encounter: Payer: Self-pay | Admitting: Physical Therapy

## 2021-06-06 ENCOUNTER — Ambulatory Visit: Payer: Medicaid Other | Admitting: Physical Therapy

## 2021-06-06 DIAGNOSIS — M6281 Muscle weakness (generalized): Secondary | ICD-10-CM

## 2021-06-06 DIAGNOSIS — I69354 Hemiplegia and hemiparesis following cerebral infarction affecting left non-dominant side: Secondary | ICD-10-CM | POA: Diagnosis not present

## 2021-06-06 DIAGNOSIS — Z7409 Other reduced mobility: Secondary | ICD-10-CM

## 2021-06-06 NOTE — Therapy (Signed)
[facility] [address] [place], [place], [address] Phone: [phone]   Fax:  [phone]  Physical Therapy Treatment  Patient Details  Name: Barry Olson MRN: [id number] Date of Birth: 1976-03-12 Referring Provider (PT): Harrel Lemon, MD   Encounter Date: 06/06/2021   PT End of Session - 06/06/21 1224     Visit Number 9    Number of Visits 16    Date for PT Re-Evaluation 07/15/21    Authorization - Visit Number 9    Authorization - Number of Visits 10    Progress Note Due on Visit 10    PT Start Time 1114    PT Stop Time 1159    PT Time Calculation (min) 45 min    Equipment Utilized During Treatment Gait belt    Activity Tolerance Patient tolerated treatment well;Patient limited by pain;Patient limited by fatigue;No increased pain;Patient limited by lethargy    Behavior During Therapy [facility] for tasks assessed/performed;Flat affect             Past Medical History:  Diagnosis Date   Back pain     Past Surgical History:  Procedure Laterality Date   HERNIA REPAIR      There were no vitals filed for this visit.   Subjective Assessment - 06/06/21 1121     Subjective Pt presents to tx without any reports of pain or falls from last tx. Pt states continued adherence to standing and self mobility with the w/c.    Pertinent History Strong family support, Pt's mother would like pt to walk again to assist with transfers. Pt would like to return use of LUE. Pt was standing with modified walking when d/c from hospital/rehab last year.    Limitations Walking;Lifting;Standing;House hold activities    How long can you sit comfortably? 5-10 mins    How long can you stand comfortably? Unable due to caregiver fear of falling.    How long can you walk comfortably? Unable    Diagnostic tests x-ray 10/06/20: WNL    Patient Stated Goals Increase use of LUE, Walking and preform transfers with independence.    Currently in Pain?  No/denies    Pain Location Shoulder    Pain Descriptors / Indicators Aching    Pain Onset More than a month ago               Neuromuscular Re-Education:    NuStep 5 mins L4 without UE assist 2/2 to poor grip and control of LUE. PT required contact assist of LE to sure alignment was correct. *not billed*  //bars:  1) STS x4 with standing tolerance from 1-3 minutes for varying targets for hand touching with RUE. Pt requires mod A during STS transfer and CGA-mod A for standing balance. Pt requires R UE assist in // bars. Pt request seated rest breaks 2/2 R knee pain.    2) walking // bars distance 84f x3 with CGA-mod A with gait belt donned to eliminate fall risk. Verbal and contact cueing to facilitate optimal tissue loading and correct biomechanical alignment to ensure efficient and safe movement.   3) walking with quad cane in REU distance 242fx1 with CGA-mod A with gait belt donned to eliminate fall risk. Verbal and contact cueing to facilitate optimal tissue loading and correct biomechanical alignment to ensure efficient and safe movement.   Pt displayed good w/c mobility with R UE and R LE. Pt was able to display w/c mobility across 30  ft of even ground, uneven side walk, and over door thresholds.  with verbal and contact cueing to ensure LLE remained on rest. Pt displayed improved abilities  with getting the w/c over a door threshold. Pt displays increased difficultly controlling speed on a down pitch and gaining movement on a upward pitch.        PT Long Term Goals - 05/30/21 1121       PT LONG TERM GOAL #1   Title Pt will increase FOTO score to predicted value of 38 for improvement in self-reported function with ADL    Baseline 12 10/14: 34    Time 6    Period Weeks    Status Partially Met    Target Date 07/25/21      PT LONG TERM GOAL #2   Title Pt will be Mod I with bed-to-w/c transfers when transfering to the R with sliding board and occasional cueing     Baseline CGA-minA with therapist. max A-TD with mother. 10/24: Pt was able to complete w/c-to-bed and bed-to-w/c with occ verbal cueing and CGA assist without sliding board    Time 6    Period Weeks    Status Partially Met    Target Date 07/25/21      PT LONG TERM GOAL #3   Title Pt will be independent with HEP in order to improve strength and decrease back pain in order to improve seated tolerance at home    Baseline Pt current states severe LBP during seated posuture of 5-10 mins 10/24: Pt states that he is currently able to sit WNL without any LBP    Time 6    Period Weeks    Status Achieved    Target Date 05/30/21      PT LONG TERM GOAL #4   Title Pt will tolerate 5 mins of standing in // bars with Min A to promote WB and decreased pressure to post pelvic in seated/supine posture    Baseline deferred to later tx. 10/24: 21mns. Pt displayed marked RLE fatigue with visable quad shaking.    Time 6    Period Weeks    Status Partially Met    Target Date 07/25/21      PT LONG TERM GOAL #5   Title Pt will amb 30 ft with FWW and Mod A with a gait belt donned for greater access to functional mobility in the home.    Baseline Pt is currently able to amb 20 Ft in the // bars with Mod A and extensive verbal and contact cueing to ensure safe mobility.    Time 6    Period Weeks    Status New    Target Date 07/25/21                   Plan - 06/06/21 1225     Clinical Impression Statement Today's tx focused on increased gait ability. Pt was able to display 288fof amb with a quad cane in RUE with CGA-Mod A. Pt displayed increased fall risk during turning with quad cane. Pt continues to display mark  L LE/UE endurance, strength, and ROM deficits resulting in decreased safe home/community mobiity, increased pain, and limited access to QOL. Pt. will continue to benefit from skilled physical therapy to progress POC to address remaining deficits to facilitate maximum functional capacity for  optimal personal health and wellness for ADLs.    Personal Factors and Comorbidities Age;Comorbidity 3+    Examination-Activity Limitations Bathing;Reach Overhead;Stairs;Stand;Dressing;Bed Mobility;Hygiene/Grooming;Lift;Transfers;Carry;Locomotion  Level    Examination-Participation Restrictions Occupation    Stability/Clinical Decision Making Evolving/Moderate complexity    Clinical Decision Making Moderate    Rehab Potential Fair    PT Frequency 2x / week    PT Duration 8 weeks    PT Treatment/Interventions ADLs/Self Care Home Management;Biofeedback;Electrical Stimulation;Cryotherapy;Moist Heat;Traction;Ultrasound;DME Instruction;Gait training;Stair training;Functional mobility training;Therapeutic activities;Neuromuscular re-education;Balance training;Therapeutic exercise;Patient/family education;Manual techniques;Wheelchair mobility training;Energy conservation;Dry needling;Passive range of motion;Vestibular;Joint Manipulations;Spinal Manipulations    PT Next Visit Plan Assesss gait with quad cane in // bars.    PT Home Exercise Plan [email or web address]    Consulted and Agree with Plan of Care Patient;Family member/caregiver    Family Member Consulted Mother             Patient will benefit from skilled therapeutic intervention in order to improve the following deficits and impairments:  Abnormal gait, Cardiopulmonary status limiting activity, Decreased balance, Decreased activity tolerance, Decreased cognition, Decreased coordination, Decreased endurance, Decreased knowledge of use of DME, Decreased mobility, Decreased safety awareness, Decreased range of motion, Difficulty walking, Decreased strength, Hypomobility, Hypermobility, Increased muscle spasms, Impaired flexibility, Impaired perceived functional ability, Impaired tone, Impaired UE functional use, Postural dysfunction, Improper body mechanics, Pain  Visit Diagnosis: Hemiplegia and hemiparesis following cerebral infarction affecting left  non-dominant side (HCC)  Muscle weakness (generalized)  Decreased independence with transfers  Spastic hemiplegia of left 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  Pura Spice, PT, DPT # [id number] Fara Olden, SPT 06/06/2021, 12:42 PM  [facility] [address] [place], [place], [address] Phone: [phone]   Fax:  [phone]  Name: Barry Olson MRN: [id number] Date of Birth: Oct 24, 1975

## 2021-06-08 ENCOUNTER — Encounter: Payer: Self-pay | Admitting: Physical Therapy

## 2021-06-08 ENCOUNTER — Ambulatory Visit: Payer: Medicaid Other | Attending: Internal Medicine | Admitting: Physical Therapy

## 2021-06-08 ENCOUNTER — Other Ambulatory Visit: Payer: Self-pay

## 2021-06-08 DIAGNOSIS — M6281 Muscle weakness (generalized): Secondary | ICD-10-CM | POA: Diagnosis present

## 2021-06-08 DIAGNOSIS — I69354 Hemiplegia and hemiparesis following cerebral infarction affecting left non-dominant side: Secondary | ICD-10-CM | POA: Diagnosis not present

## 2021-06-08 DIAGNOSIS — Z7409 Other reduced mobility: Secondary | ICD-10-CM | POA: Diagnosis present

## 2021-06-08 NOTE — Therapy (Signed)
[facility] [address] [place], [place], [address] Phone: [phone]   Fax:  [phone]  Physical Therapy Treatment and Progress Note 04/18/2021-06/08/2021  Patient Details  Name: Barry Olson MRN: [id number] Date of Birth: 15-Sep-1975 Referring Provider (PT): Harrel Lemon, MD   Encounter Date: 06/08/2021   PT End of Session - 06/08/21 1056     Visit Number 10    Number of Visits 16    Date for PT Re-Evaluation 07/15/21    Authorization - Visit Number 10    Authorization - Number of Visits 10    Progress Note Due on Visit 10    PT Start Time 7412    PT Stop Time 8786    PT Time Calculation (min) 47 min    Equipment Utilized During Treatment Gait belt    Activity Tolerance Patient tolerated treatment well;Patient limited by pain;Patient limited by fatigue;No increased pain;Patient limited by lethargy    Behavior During Therapy [facility] for tasks assessed/performed;Flat affect             Past Medical History:  Diagnosis Date   Back pain     Past Surgical History:  Procedure Laterality Date   HERNIA REPAIR      There were no vitals filed for this visit.   Subjective Assessment - 06/08/21 1103     Subjective Pt present to tx without reports of pain. Pt states that he got some new fishing gear and is hoping to get family assist to go fishing this weekend. Pt states c/c of poor walking balance and LUE use. Pt states that he has not heard from [employer] on a hand splint.    Pertinent History Strong family support, Pt's mother would like pt to walk again to assist with transfers. Pt would like to return use of LUE. Pt was standing with modified walking when d/c from hospital/rehab last year.    Limitations Walking;Lifting;Standing;House hold activities    How long can you sit comfortably? 5-10 mins    How long can you stand comfortably? Unable due to caregiver fear of falling.    How long can you walk  comfortably? Unable    Diagnostic tests x-ray 10/06/20: WNL    Patient Stated Goals Increase use of LUE, Walking and preform transfers with independence.    Currently in Pain? No/denies    Pain Descriptors / Indicators Aching    Pain Onset More than a month ago               Treatment and reassessment:    Therapeutic Exercise:  NuStep 5 mins L4 without UE assist 2/2 to poor grip and control of LUE. PT required contact assist of LE to sure alignment was correct.    FOTO: 45 (achieved on the 10th visit)  Bed transfers: ISQ from previous assessment in 10/24  Standing tolerance/ balance: 4 Mins with only occ R UE touch assit and occ wood dowel RUE shoulder flexion to mimic casting a fishing rod.  Gait: Pt was able to amb 40 ft with quad cane and CGA- Min A with extensive verbal cueing for proper LE sequencing.   Neuromuscular Re-Education:     //bars:  1) STS x4 with standing tolerance from 1-3 minutes for wood dowel and 2# weight place low to mimic a fishing rode for casting motion 3x15 . Pt requires mod A during STS transfer and CGA-mod A for standing balance. Pt requires R UE assist in // bars.  2) walking // bars distance 30f x2 with CGA-mod A with gait belt donned to eliminate fall risk. Verbal and contact cueing to facilitate optimal tissue loading and correct biomechanical alignment to ensure efficient and safe movement.   3) walking with quad cane in RUE distance 429fx1 with CGA-mod A with gait belt donned to eliminate fall risk. Verbal and contact cueing to facilitate optimal tissue loading and correct biomechanical alignment to ensure efficient and safe movement.               PT Long Term Goals - 06/08/21 1107       PT LONG TERM GOAL #1   Title Pt will increase FOTO score to predicted value of 38 for improvement in self-reported function with ADL    Baseline 12 10/14: 34 11/22: 45 (achieved on the 10th visit)    Time 6    Period Weeks     Status Achieved    Target Date 06/08/21      PT LONG TERM GOAL #2   Title Pt will be Mod I with bed-to-w/c transfers when transfering to the R with sliding board and occasional cueing    Baseline CGA-minA with therapist. max A-TD with mother. 10/24: Pt was able to complete w/c-to-bed and bed-to-w/c with occ verbal cueing and CGA assist without sliding board 11/2: ISQ from previous assessment in 10/24    Time 6    Period Weeks    Status Partially Met    Target Date 07/25/21      PT LONG TERM GOAL #3   Title Pt will be independent with HEP in order to improve strength and decrease back pain in order to improve seated tolerance at home    Baseline Pt current states severe LBP during seated posuture of 5-10 mins 10/24: Pt states that he is currently able to sit WNL without any LBP    Time 6    Period Weeks    Status Achieved    Target Date 05/30/21      PT LONG TERM GOAL #4   Title Pt will tolerate 5 mins of standing in // bars with Min A to promote WB and decreased pressure to post pelvic in seated/supine posture    Baseline deferred to later tx. 10/24: 2m63m. Pt displayed marked RLE fatigue with visable quad shaking. 11/2: 4 Mins with only occ R UE touch assit and occ wood dowel RUE shoulder flexion to minmic casting a fishing rod.    Time 6    Period Weeks    Status Partially Met    Target Date 07/25/21      PT LONG TERM GOAL #5   Title Pt will amb 60 ft with Quad cane and CGA-MIN A with a gait belt donned for greater access to functional mobility in the home.    Baseline Pt is currently able to amb 20 Ft in the // bars with Mod A and extensive verbal and contact cueing to ensure safe mobility. 11/2: Pt was able to amb 40 ft with quad cane and CGA- Min A with extensive verbal cueing for proper LE sequencing.    Time 6    Period Weeks    Status New    Target Date 07/25/21                   Plan - 06/08/21 1056     Clinical Impression Statement Pt was reassessed on this  date 06/08/2021. Pt examination displayed the following remaining  deficits: ?FOTO: 45 (achieved on the 10th visit). Bed transfers: ISQ from previous assessment in 10/24. Standing tolerance/ balance: 4 Mins with only occ R UE touch assit and occ wood dowel RUE shoulder flexion to mimic casting a fishing rod. Gait: Pt was able to amb 40 ft with quad cane and CGA- Min A with extensive verbal cueing for proper LE sequencing. Pt has displayed excellent progression and will continue to progression with greater gait and mobility independence. Pt continues to display mark L LE/UE endurance, strength, and ROM deficits resulting in decreased safe home/community mobiity, increased pain, and limited access to QOL. Pt. will continue to benefit from skilled physical therapy to progress POC to address remaining deficits to facilitate maximum functional capacity for optimal personal health and wellness for ADLs.    Personal Factors and Comorbidities Age;Comorbidity 3+    Examination-Activity Limitations Bathing;Reach Overhead;Stairs;Stand;Dressing;Bed Mobility;Hygiene/Grooming;Lift;Transfers;Carry;Locomotion Level    Examination-Participation Restrictions Occupation    Stability/Clinical Decision Making Evolving/Moderate complexity    Clinical Decision Making Moderate    Rehab Potential Fair    PT Frequency 2x / week    PT Duration 8 weeks    PT Treatment/Interventions ADLs/Self Care Home Management;Biofeedback;Electrical Stimulation;Cryotherapy;Moist Heat;Traction;Ultrasound;DME Instruction;Gait training;Stair training;Functional mobility training;Therapeutic activities;Neuromuscular re-education;Balance training;Therapeutic exercise;Patient/family education;Manual techniques;Wheelchair mobility training;Energy conservation;Dry needling;Passive range of motion;Vestibular;Joint Manipulations;Spinal Manipulations    PT Next Visit Plan continue gait progression    PT Home Exercise Plan [email or web address]    Consulted and Agree with  Plan of Care Patient;Family member/caregiver    Family Member Consulted Mother             Patient will benefit from skilled therapeutic intervention in order to improve the following deficits and impairments:  Abnormal gait, Cardiopulmonary status limiting activity, Decreased balance, Decreased activity tolerance, Decreased cognition, Decreased coordination, Decreased endurance, Decreased knowledge of use of DME, Decreased mobility, Decreased safety awareness, Decreased range of motion, Difficulty walking, Decreased strength, Hypomobility, Hypermobility, Increased muscle spasms, Impaired flexibility, Impaired perceived functional ability, Impaired tone, Impaired UE functional use, Postural dysfunction, Improper body mechanics, Pain  Visit Diagnosis: Hemiplegia and hemiparesis following cerebral infarction affecting left non-dominant side (HCC)  Muscle weakness (generalized)  Decreased independence with transfers  Spastic hemiplegia of left nondominant side as late effect of cerebral infarction [facility])     Problem List There are no problems to display for this patient.  Pura Spice, PT, DPT # [id number] Fara Olden, Student-PT 06/08/2021, 11:57 AM  [facility] [address] [place], [place], [address] Phone: [phone]   Fax:  [phone]  Name: OSAMU OLGUIN MRN: [id number] Date of Birth: 1976-01-19

## 2021-06-13 ENCOUNTER — Ambulatory Visit: Payer: Medicaid Other | Admitting: Physical Therapy

## 2021-06-15 ENCOUNTER — Encounter: Payer: Self-pay | Admitting: Physical Therapy

## 2021-06-15 ENCOUNTER — Other Ambulatory Visit: Payer: Self-pay

## 2021-06-15 ENCOUNTER — Ambulatory Visit: Payer: Medicaid Other | Admitting: Physical Therapy

## 2021-06-15 DIAGNOSIS — I69354 Hemiplegia and hemiparesis following cerebral infarction affecting left non-dominant side: Secondary | ICD-10-CM

## 2021-06-15 DIAGNOSIS — M6281 Muscle weakness (generalized): Secondary | ICD-10-CM

## 2021-06-15 DIAGNOSIS — Z7409 Other reduced mobility: Secondary | ICD-10-CM

## 2021-06-15 NOTE — Therapy (Signed)
[facility] [address] [place], [place], [address] Phone: [phone]   Fax:  [phone]  Physical Therapy Treatment  Patient Details  Name: Barry Olson MRN: [id number] Date of Birth: [birth year] Referring Provider (PT): Harrel Lemon, MD   Encounter Date: 06/15/2021   PT End of Session - 06/15/21 1217     Visit Number 11    Number of Visits 16    Date for PT Re-Evaluation 07/15/21    Authorization - Visit Number 1    Authorization - Number of Visits 10    Progress Note Due on Visit 10    PT Start Time 1112    PT Stop Time 1158    PT Time Calculation (min) 46 min    Equipment Utilized During Treatment Gait belt    Activity Tolerance Patient tolerated treatment well;Patient limited by pain;Patient limited by fatigue;No increased pain;Patient limited by lethargy    Behavior During Therapy [facility] for tasks assessed/performed;Flat affect             Past Medical History:  Diagnosis Date   Back pain     Past Surgical History:  Procedure Laterality Date   HERNIA REPAIR      There were no vitals filed for this visit.   Subjective Assessment - 06/15/21 1212     Subjective Pt presents to tx with reports of increased standing and walking with family SUP and quad cane in RUE. Pt states no pain or falls since last tx.    Pertinent History Strong family support, Pt's mother would like pt to walk again to assist with transfers. Pt would like to return use of LUE. Pt was standing with modified walking when d/c from hospital/rehab last year.    Limitations Walking;Lifting;Standing;House hold activities    How long can you sit comfortably? 5-10 mins    How long can you stand comfortably? Unable due to caregiver fear of falling.    How long can you walk comfortably? Unable    Diagnostic tests x-ray 10/06/20: WNL    Patient Stated Goals Increase use of LUE, Walking and preform transfers with independence.    Currently in Pain?  No/denies    Pain Onset More than a month ago                Neuromuscular Re-Education:  //bars:   1) walking // bars distance 149f x3 with CGA-mod A with gait belt donned to eliminate fall risk. Verbal and contact cueing to facilitate optimal tissue loading and correct biomechanical alignment to ensure efficient and safe movement.   2) walking with quad cane in REU distance 272fx1 with CGA-mod A with gait belt donned to eliminate fall risk. Verbal and contact cueing to facilitate optimal tissue loading and correct biomechanical alignment to ensure efficient and safe movement.  3) Standing in the // bar without UE assist for 1 mins bouts x 5 with light trunk perturbation or varying targets to facilitate trunk leaning with hip strategies for optimal standing balance.     Pt displayed good w/c mobility with R UE and R LE. Pt was able to display w/c mobility across 80 ft of even ground, uneven side walk, and over door thresholds.  with verbal and contact cueing to ensure LLE remained on rest. Pt displayed improved abilities  with getting the w/c over a door threshold. Pt displays increased difficultly controlling speed on a down pitch and gaining movement on a upward pitch.  PT Long Term Goals - 06/08/21 1107       PT LONG TERM GOAL #1   Title Pt will increase FOTO score to predicted value of 38 for improvement in self-reported function with ADL    Baseline 12 10/14: 34 11/22: 45 (achieved on the 10th visit)    Time 6    Period Weeks    Status Achieved    Target Date 06/08/21      PT LONG TERM GOAL #2   Title Pt will be Mod I with bed-to-w/c transfers when transfering to the R with sliding board and occasional cueing    Baseline CGA-minA with therapist. max A-TD with mother. 10/24: Pt was able to complete w/c-to-bed and bed-to-w/c with occ verbal cueing and CGA assist without sliding board 11/2: ISQ from previous assessment in 10/24    Time 6    Period Weeks    Status  Partially Met    Target Date 07/25/21      PT LONG TERM GOAL #3   Title Pt will be independent with HEP in order to improve strength and decrease back pain in order to improve seated tolerance at home    Baseline Pt current states severe LBP during seated posuture of 5-10 mins 10/24: Pt states that he is currently able to sit WNL without any LBP    Time 6    Period Weeks    Status Achieved    Target Date 05/30/21      PT LONG TERM GOAL #4   Title Pt will tolerate 5 mins of standing in // bars with Min A to promote WB and decreased pressure to post pelvic in seated/supine posture    Baseline deferred to later tx. 10/24: 76mns. Pt displayed marked RLE fatigue with visable quad shaking. 11/2: 4 Mins with only occ R UE touch assit and occ wood dowel RUE shoulder flexion to minmic casting a fishing rod.    Time 6    Period Weeks    Status Partially Met    Target Date 07/25/21      PT LONG TERM GOAL #5   Title Pt will amb 60 ft with Quad cane and CGA-MIN A with a gait belt donned for greater access to functional mobility in the home.    Baseline Pt is currently able to amb 20 Ft in the // bars with Mod A and extensive verbal and contact cueing to ensure safe mobility. 11/2: Pt was able to amb 40 ft with quad cane and CGA- Min A with extensive verbal cueing for proper LE sequencing.    Time 6    Period Weeks    Status New    Target Date 07/25/21                   Plan - 06/15/21 1218     Clinical Impression Statement Today's tx was focused on progression of walking distances in the // bars with 100 ft x 3. Pt displayed increase LLE fatigue in standing by the end of each bout. Pt states no pain at the end of tx. Pt continues to display mark L LE/UE endurance, strength, and ROM deficits resulting in decreased safe home/community mobiity, increased pain, and limited access to QOL. Pt. will continue to benefit from skilled physical therapy to progress POC to address remaining deficits to  facilitate maximum functional capacity for optimal personal health and wellness for ADLs.    Personal Factors and Comorbidities Age;Comorbidity 3+  Examination-Activity Limitations Bathing;Reach Overhead;Stairs;Stand;Dressing;Bed Mobility;Hygiene/Grooming;Lift;Transfers;Carry;Locomotion Level    Examination-Participation Restrictions Occupation    Stability/Clinical Decision Making Evolving/Moderate complexity    Clinical Decision Making Moderate    Rehab Potential Fair    PT Frequency 2x / week    PT Duration 8 weeks    PT Treatment/Interventions ADLs/Self Care Home Management;Biofeedback;Electrical Stimulation;Cryotherapy;Moist Heat;Traction;Ultrasound;DME Instruction;Gait training;Stair training;Functional mobility training;Therapeutic activities;Neuromuscular re-education;Balance training;Therapeutic exercise;Patient/family education;Manual techniques;Wheelchair mobility training;Energy conservation;Dry needling;Passive range of motion;Vestibular;Joint Manipulations;Spinal Manipulations    PT Next Visit Plan continue gait progression    PT Home Exercise Plan [email or web address]    Consulted and Agree with Plan of Care Patient;Family member/caregiver    Family Member Consulted Mother             Patient will benefit from skilled therapeutic intervention in order to improve the following deficits and impairments:  Abnormal gait, Cardiopulmonary status limiting activity, Decreased balance, Decreased activity tolerance, Decreased cognition, Decreased coordination, Decreased endurance, Decreased knowledge of use of DME, Decreased mobility, Decreased safety awareness, Decreased range of motion, Difficulty walking, Decreased strength, Hypomobility, Hypermobility, Increased muscle spasms, Impaired flexibility, Impaired perceived functional ability, Impaired tone, Impaired UE functional use, Postural dysfunction, Improper body mechanics, Pain  Visit Diagnosis: Hemiplegia and hemiparesis following cerebral  infarction affecting left non-dominant side (HCC)  Muscle weakness (generalized)  Decreased independence with transfers  Spastic hemiplegia of left 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  Pura Spice, PT, DPT # [id number] Fara Olden, SPT 06/15/2021, 1:57 PM  [facility] [address] [place], [place], [address] Phone: [phone]   Fax:  [phone]  Name: Barry Olson MRN: [id number] Date of Birth: [birth year]

## 2021-06-20 ENCOUNTER — Ambulatory Visit: Payer: Medicaid Other | Admitting: Physical Therapy

## 2021-06-20 ENCOUNTER — Other Ambulatory Visit: Payer: Self-pay

## 2021-06-20 ENCOUNTER — Encounter: Payer: Self-pay | Admitting: Physical Therapy

## 2021-06-20 DIAGNOSIS — I69354 Hemiplegia and hemiparesis following cerebral infarction affecting left non-dominant side: Secondary | ICD-10-CM | POA: Diagnosis not present

## 2021-06-20 DIAGNOSIS — Z7409 Other reduced mobility: Secondary | ICD-10-CM

## 2021-06-20 DIAGNOSIS — M6281 Muscle weakness (generalized): Secondary | ICD-10-CM

## 2021-06-20 NOTE — Therapy (Signed)
[facility] [address] [place], [place], [address] Phone: [phone]   Fax:  [phone]  Physical Therapy Treatment  Patient Details  Name: Barry Olson MRN: [id number] Date of Birth: August 29, 1975 Referring Provider (PT): Harrel Lemon, MD   Encounter Date: 06/20/2021   PT End of Session - 06/20/21 1529     Visit Number 12    Number of Visits 16    Date for PT Re-Evaluation 07/15/21    Authorization - Visit Number 2    Authorization - Number of Visits 10    Progress Note Due on Visit 10    PT Start Time 1426    PT Stop Time 1511    PT Time Calculation (min) 45 min    Equipment Utilized During Treatment Gait belt    Activity Tolerance Patient tolerated treatment well;Patient limited by pain;Patient limited by fatigue;No increased pain;Patient limited by lethargy    Behavior During Therapy [facility] for tasks assessed/performed;Flat affect             Past Medical History:  Diagnosis Date   Back pain     Past Surgical History:  Procedure Laterality Date   HERNIA REPAIR      There were no vitals filed for this visit.   Subjective Assessment - 06/20/21 1525     Subjective Pt presents to tx with no complaints of knee or LBP. Pt states that he heard a pop in his L shoulder during her shoulder flexion AROM. Pt states minor increase in pain compared to basline.    Pertinent History Strong family support, Pt's mother would like pt to walk again to assist with transfers. Pt would like to return use of LUE. Pt was standing with modified walking when d/c from hospital/rehab last year.    Limitations Walking;Lifting;Standing;House hold activities    How long can you sit comfortably? 5-10 mins    How long can you stand comfortably? Unable due to caregiver fear of falling.    How long can you walk comfortably? Unable    Diagnostic tests x-ray 10/06/20: WNL    Patient Stated Goals Increase use of LUE, Walking and preform  transfers with independence.    Currently in Pain? Yes    Pain Location Shoulder    Pain Onset More than a month ago             L Shoulder assessment:   Radial pulse: WNL  Capillary refill: WNL  Sulcus Sign: + 2 finger consistent  with baseline  PROM Shoulder flexion: consistent with baseline    Neuromuscular Re-Education:  //bars:   1) walking with quad cane in REU distance 68f x1 and 50 ft x1 with CGA-mod A with gait belt donned to eliminate fall risk. Verbal and contact cueing to facilitate optimal tissue loading and correct biomechanical alignment to ensure efficient and safe movement.  2) Standing in at the stairs without UE assist for 3 mins bouts x 2 with light trunk perturbation or varying targets to facilitate trunk leaning with hip strategies for optimal standing balance.     3) Pt displayed good w/c mobility with R UE and R LE. Pt was able to display w/c mobility across  even ground, uneven blue mat, figure-8 with cones, backwards and over door thresholds.  with verbal and contact cueing to ensure LLE remained on rest.        PT Long Term Goals - 06/08/21 1107       PT  LONG TERM GOAL #1   Title Pt will increase FOTO score to predicted value of 38 for improvement in self-reported function with ADL    Baseline 12 10/14: 34 11/22: 45 (achieved on the 10th visit)    Time 6    Period Weeks    Status Achieved    Target Date 06/08/21      PT LONG TERM GOAL #2   Title Pt will be Mod I with bed-to-w/c transfers when transfering to the R with sliding board and occasional cueing    Baseline CGA-minA with therapist. max A-TD with mother. 10/24: Pt was able to complete w/c-to-bed and bed-to-w/c with occ verbal cueing and CGA assist without sliding board 11/2: ISQ from previous assessment in 10/24    Time 6    Period Weeks    Status Partially Met    Target Date 07/25/21      PT LONG TERM GOAL #3   Title Pt will be independent with HEP in order to improve strength  and decrease back pain in order to improve seated tolerance at home    Baseline Pt current states severe LBP during seated posuture of 5-10 mins 10/24: Pt states that he is currently able to sit WNL without any LBP    Time 6    Period Weeks    Status Achieved    Target Date 05/30/21      PT LONG TERM GOAL #4   Title Pt will tolerate 5 mins of standing in // bars with Min A to promote WB and decreased pressure to post pelvic in seated/supine posture    Baseline deferred to later tx. 10/24: 3mns. Pt displayed marked RLE fatigue with visable quad shaking. 11/2: 4 Mins with only occ R UE touch assit and occ wood dowel RUE shoulder flexion to minmic casting a fishing rod.    Time 6    Period Weeks    Status Partially Met    Target Date 07/25/21      PT LONG TERM GOAL #5   Title Pt will amb 60 ft with Quad cane and CGA-MIN A with a gait belt donned for greater access to functional mobility in the home.    Baseline Pt is currently able to amb 20 Ft in the // bars with Mod A and extensive verbal and contact cueing to ensure safe mobility. 11/2: Pt was able to amb 40 ft with quad cane and CGA- Min A with extensive verbal cueing for proper LE sequencing.    Time 6    Period Weeks    Status New    Target Date 07/25/21                   Plan - 06/20/21 1530     Clinical Impression Statement Today's tx was focused on ambulation with QC outside of the bars for greater distances, obstacle course for w/c mobility, and greater standing tolerance. L shoulder assessment 2/2 pt reported pop: Radial pulse: WNL. Capillary refill: WNL. Sulcus Sign: + 2 finger consistent  with baseline. PROM Shoulder flexion: consistent with baseline. Pt continues to display mark L LE/UE endurance, strength, and ROM deficits resulting in decreased safe home/community mobiity, increased pain, and limited access to QOL. Pt. will continue to benefit from skilled physical therapy to progress POC to address remaining  deficits to facilitate maximum functional capacity for optimal personal health and wellness for ADLs.    Personal Factors and Comorbidities Age;Comorbidity 3+    Examination-Activity Limitations  Bathing;Reach Overhead;Stairs;Stand;Dressing;Bed Mobility;Hygiene/Grooming;Lift;Transfers;Carry;Locomotion Level    Examination-Participation Restrictions Occupation    Stability/Clinical Decision Making Evolving/Moderate complexity    Clinical Decision Making Moderate    Rehab Potential Fair    PT Frequency 2x / week    PT Duration 8 weeks    PT Treatment/Interventions ADLs/Self Care Home Management;Biofeedback;Electrical Stimulation;Cryotherapy;Moist Heat;Traction;Ultrasound;DME Instruction;Gait training;Stair training;Functional mobility training;Therapeutic activities;Neuromuscular re-education;Balance training;Therapeutic exercise;Patient/family education;Manual techniques;Wheelchair mobility training;Energy conservation;Dry needling;Passive range of motion;Vestibular;Joint Manipulations;Spinal Manipulations    PT Next Visit Plan continue gait progression    PT Home Exercise Plan [email or web address]    Consulted and Agree with Plan of Care Patient;Family member/caregiver    Family Member Consulted Mother             Patient will benefit from skilled therapeutic intervention in order to improve the following deficits and impairments:  Abnormal gait, Cardiopulmonary status limiting activity, Decreased balance, Decreased activity tolerance, Decreased cognition, Decreased coordination, Decreased endurance, Decreased knowledge of use of DME, Decreased mobility, Decreased safety awareness, Decreased range of motion, Difficulty walking, Decreased strength, Hypomobility, Hypermobility, Increased muscle spasms, Impaired flexibility, Impaired perceived functional ability, Impaired tone, Impaired UE functional use, Postural dysfunction, Improper body mechanics, Pain  Visit Diagnosis: Hemiplegia and hemiparesis  following cerebral infarction affecting left non-dominant side (HCC)  Muscle weakness (generalized)  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  Pura Spice, PT, DPT # [id number] Fara Olden, SPT 06/21/2021, 8:37 AM  [facility] [address] [place], [place], [address] Phone: [phone]   Fax:  [phone]  Name: TODD JELINSKI MRN: [id number] Date of Birth: May 27, 1976

## 2021-06-22 ENCOUNTER — Ambulatory Visit: Payer: Medicaid Other | Admitting: Physical Therapy

## 2021-06-22 ENCOUNTER — Other Ambulatory Visit: Payer: Self-pay

## 2021-06-22 ENCOUNTER — Encounter: Payer: Self-pay | Admitting: Physical Therapy

## 2021-06-22 DIAGNOSIS — Z7409 Other reduced mobility: Secondary | ICD-10-CM

## 2021-06-22 DIAGNOSIS — I69354 Hemiplegia and hemiparesis following cerebral infarction affecting left non-dominant side: Secondary | ICD-10-CM | POA: Diagnosis not present

## 2021-06-22 DIAGNOSIS — M6281 Muscle weakness (generalized): Secondary | ICD-10-CM

## 2021-06-22 NOTE — Therapy (Signed)
[facility] [address] [place], [place], [address] Phone: [phone]   Fax:  [phone]  Physical Therapy Treatment  Patient Details  Name: Barry Olson MRN: [id number] Date of Birth: April 17, 1976 Referring Provider (PT): Harrel Lemon, MD   Encounter Date: 06/22/2021   PT End of Session - 06/22/21 1610     Visit Number 13    Number of Visits 16    Date for PT Re-Evaluation 07/15/21    Authorization - Visit Number 3    Authorization - Number of Visits 10    Progress Note Due on Visit 10    PT Start Time 1346    PT Stop Time 1432    PT Time Calculation (min) 46 min    Equipment Utilized During Treatment Gait belt    Activity Tolerance Patient tolerated treatment well;Patient limited by pain;Patient limited by fatigue;No increased pain;Patient limited by lethargy    Behavior During Therapy [facility] for tasks assessed/performed;Flat affect             Past Medical History:  Diagnosis Date   Back pain     Past Surgical History:  Procedure Laterality Date   HERNIA REPAIR      There were no vitals filed for this visit.   Subjective Assessment - 06/22/21 1609     Subjective Pt reports to tx with no increase in pain. Pt states that he has started independent transfers from the bed to the w/c with assistance for greater mobility.    Pertinent History Strong family support, Pt's mother would like pt to walk again to assist with transfers. Pt would like to return use of LUE. Pt was standing with modified walking when d/c from hospital/rehab last year.    Limitations Walking;Lifting;Standing;House hold activities    How long can you sit comfortably? 5-10 mins    How long can you stand comfortably? Unable due to caregiver fear of falling.    How long can you walk comfortably? Unable    Diagnostic tests x-ray 10/06/20: WNL    Patient Stated Goals Increase use of LUE, Walking and preform transfers with independence.     Currently in Pain? No/denies    Pain Onset More than a month ago                L Shoulder assessment:   TUG with quad cane: 1 mins 3 sec  5xSTS with R UE assits at // bars: 30 sec  5xSTS with R UE on W/C: unable to complete x5 Standing duration: 6 mins    Neuromuscular Re-Education:  //bars:   1) walking // bars with REU distance 75f x3 and 40 ft x1 with 4 # ankle weights bilat. CGA-mod A with gait belt donned to eliminate fall risk. Verbal and contact cueing to facilitate optimal tissue loading and correct biomechanical alignment to ensure efficient and safe movement.    2) seated LAQ with 4# ankle weights bilat and extensive verbal and contact cueing to facilitate optimal LE coordination and strength of the quadriceps.          PT Long Term Goals - 06/08/21 1107       PT LONG TERM GOAL #1   Title Pt will increase FOTO score to predicted value of 38 for improvement in self-reported function with ADL    Baseline 12 10/14: 34 11/22: 45 (achieved on the 10th visit)    Time 6    Period Weeks    Status  Achieved    Target Date 06/08/21      PT LONG TERM GOAL #2   Title Pt will be Mod I with bed-to-w/c transfers when transfering to the R with sliding board and occasional cueing    Baseline CGA-minA with therapist. max A-TD with mother. 10/24: Pt was able to complete w/c-to-bed and bed-to-w/c with occ verbal cueing and CGA assist without sliding board 11/2: ISQ from previous assessment in 10/24    Time 6    Period Weeks    Status Partially Met    Target Date 07/25/21      PT LONG TERM GOAL #3   Title Pt will be independent with HEP in order to improve strength and decrease back pain in order to improve seated tolerance at home    Baseline Pt current states severe LBP during seated posuture of 5-10 mins 10/24: Pt states that he is currently able to sit WNL without any LBP    Time 6    Period Weeks    Status Achieved    Target Date 05/30/21      PT LONG TERM  GOAL #4   Title Pt will tolerate 5 mins of standing in // bars with Min A to promote WB and decreased pressure to post pelvic in seated/supine posture    Baseline deferred to later tx. 10/24: 38mns. Pt displayed marked RLE fatigue with visable quad shaking. 11/2: 4 Mins with only occ R UE touch assit and occ wood dowel RUE shoulder flexion to minmic casting a fishing rod.    Time 6    Period Weeks    Status Partially Met    Target Date 07/25/21      PT LONG TERM GOAL #5   Title Pt will amb 60 ft with Quad cane and CGA-MIN A with a gait belt donned for greater access to functional mobility in the home.    Baseline Pt is currently able to amb 20 Ft in the // bars with Mod A and extensive verbal and contact cueing to ensure safe mobility. 11/2: Pt was able to amb 40 ft with quad cane and CGA- Min A with extensive verbal cueing for proper LE sequencing.    Time 6    Period Weeks    Status New    Target Date 07/25/21                   Plan - 06/22/21 1611     Clinical Impression Statement Today's tx was focused on assessment of LE functional power with TUG with quad cane: 1 mins 3 sec. 5xSTS with R UE assist at // bars: 30 sec. 5xSTS with R UE on W/C: unable to complete x5. Standing duration: 6 mins. Pt continues to display excellent progression with standing and amb tasks. Pt continues to display mark LE/UE endurance, strength, and ROM deficits resulting in decreased safe home/community mobiity, increased pain, and limited access to QOL. Pt. will continue to benefit from skilled physical therapy to progress POC to address remaining deficits to facilitate maximum functional capacity for optimal personal health and wellness for ADLs.    Personal Factors and Comorbidities Age;Comorbidity 3+    Examination-Activity Limitations Bathing;Reach Overhead;Stairs;Stand;Dressing;Bed Mobility;Hygiene/Grooming;Lift;Transfers;Carry;Locomotion Level    Examination-Participation Restrictions Occupation     Stability/Clinical Decision Making Evolving/Moderate complexity    Clinical Decision Making Moderate    Rehab Potential Fair    PT Frequency 2x / week    PT Duration 8 weeks    PT Treatment/Interventions  ADLs/Self Care Home Management;Biofeedback;Electrical Stimulation;Cryotherapy;Moist Heat;Traction;Ultrasound;DME Instruction;Gait training;Stair training;Functional mobility training;Therapeutic activities;Neuromuscular re-education;Balance training;Therapeutic exercise;Patient/family education;Manual techniques;Wheelchair mobility training;Energy conservation;Dry needling;Passive range of motion;Vestibular;Joint Manipulations;Spinal Manipulations    PT Next Visit Plan Continue gait progression    PT Home Exercise Plan [email or web address]    Consulted and Agree with Plan of Care Patient;Family member/caregiver    Family Member Consulted Mother             Patient will benefit from skilled therapeutic intervention in order to improve the following deficits and impairments:  Abnormal gait, Cardiopulmonary status limiting activity, Decreased balance, Decreased activity tolerance, Decreased cognition, Decreased coordination, Decreased endurance, Decreased knowledge of use of DME, Decreased mobility, Decreased safety awareness, Decreased range of motion, Difficulty walking, Decreased strength, Hypomobility, Hypermobility, Increased muscle spasms, Impaired flexibility, Impaired perceived functional ability, Impaired tone, Impaired UE functional use, Postural dysfunction, Improper body mechanics, Pain  Visit Diagnosis: Hemiplegia and hemiparesis following cerebral infarction affecting left non-dominant side (HCC)  Muscle weakness (generalized)  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  Pura Spice, PT, DPT # [id number] Fara Olden,  SPT 06/23/2021, 9:20 AM  [facility] [facility] [address] [place], [place], [address] Phone: [phone]   Fax:  [phone]  Name: Barry Olson MRN: [id number] Date of Birth: Aug 26, 1975

## 2021-06-29 ENCOUNTER — Encounter: Payer: Self-pay | Admitting: Physical Therapy

## 2021-06-29 ENCOUNTER — Ambulatory Visit: Payer: Medicaid Other

## 2021-06-29 ENCOUNTER — Other Ambulatory Visit: Payer: Self-pay

## 2021-06-29 DIAGNOSIS — Z7409 Other reduced mobility: Secondary | ICD-10-CM

## 2021-06-29 DIAGNOSIS — I69354 Hemiplegia and hemiparesis following cerebral infarction affecting left non-dominant side: Secondary | ICD-10-CM | POA: Diagnosis not present

## 2021-06-29 DIAGNOSIS — M6281 Muscle weakness (generalized): Secondary | ICD-10-CM

## 2021-06-29 NOTE — Therapy (Signed)
[facility] [address]. [place], [place], [address] Phone: [phone]   Fax:  [phone]  Physical Therapy Treatment  Patient Details  Name: Barry Olson MRN: [id number] Date of Birth: 1976/04/20 Referring Provider (PT): Harrel Lemon, MD   Encounter Date: 06/29/2021   PT End of Session - 06/29/21 1341     Visit Number 14    Number of Visits 16    Date for PT Re-Evaluation 07/15/21    Authorization - Visit Number 4    Authorization - Number of Visits 10    Progress Note Due on Visit 10    PT Start Time 5449    PT Stop Time 1345    PT Time Calculation (min) 47 min    Equipment Utilized During Treatment Gait belt    Activity Tolerance Patient tolerated treatment well;Patient limited by pain;Patient limited by fatigue    Behavior During Therapy [facility] for tasks assessed/performed;Flat affect             Past Medical History:  Diagnosis Date   Back pain     Past Surgical History:  Procedure Laterality Date   HERNIA REPAIR      There were no vitals filed for this visit.   Subjective Assessment - 06/29/21 1300     Subjective Pt reports minor pain in LUE up to shoulder. No falls reported, still trying to get brace/splint for L wrist.    Pertinent History Strong family support, Pt's mother would like pt to walk again to assist with transfers. Pt would like to return use of LUE. Pt was standing with modified walking when d/c from hospital/rehab last year.    Limitations Walking;Lifting;Standing;House hold activities    How long can you sit comfortably? 5-10 mins    How long can you stand comfortably? Unable due to caregiver fear of falling.    How long can you walk comfortably? Unable    Diagnostic tests x-ray 10/06/20: WNL    Patient Stated Goals Increase use of LUE, Walking and preform transfers with independence.    Currently in Pain? Yes    Pain Score 7     Pain Location Arm    Pain Descriptors / Indicators  Aching    Pain Onset More than a month ago    Pain Frequency Intermittent            Neuro Re-Ed:  minA for STS transfers from w/c. Does require frequent cuing for ensuring w/c is locked prior to standing.   Amb in // bars with RUE support. X4 laps, CGA. MinA to stand from W/c.    Amb 59' in // bars. Cuing on pelvis to improve L weight shift on stance phase. Cuing for attempting heel strike on LLE with step through pattern.   LLE weight shift with RLE on airex foam, RUE support and CGA for safety. Use of mirror for visual cue for posture. X10. Reports R knee pain   Amb cone weave figure 8's with quad cane in RUE. CGA for safety with intermittent min to modA for LOB. Narrow BOS.    Requires cuing for wider BOS, step through pattern, sequencing of quad cane with turns. High falls risk outside of // bars.   Seated therex due to R knee pain: 5# AW's   Alternating hip flexion 2x20/LE    Alternating knee extension 1x10/LE   Pt requires frequent seated rest breaks due to knee pain (7/10 NPS) and LE fatigue.  PT Education - 06/29/21 1302     Education Details form/technique with gait mechanics.    Person(s) Educated Patient    Methods Explanation    Comprehension Verbalized understanding;Returned demonstration                 PT Long Term Goals - 06/08/21 1107       PT LONG TERM GOAL #1   Title Pt will increase FOTO score to predicted value of 38 for improvement in self-reported function with ADL    Baseline 12 10/14: 34 11/22: 45 (achieved on the 10th visit)    Time 6    Period Weeks    Status Achieved    Target Date 06/08/21      PT LONG TERM GOAL #2   Title Pt will be Mod I with bed-to-w/c transfers when transfering to the R with sliding board and occasional cueing    Baseline CGA-minA with therapist. max A-TD with mother. 10/24: Pt was able to complete w/c-to-bed and bed-to-w/c with occ verbal cueing and CGA assist without sliding board 11/2: ISQ from previous  assessment in 10/24    Time 6    Period Weeks    Status Partially Met    Target Date 07/25/21      PT LONG TERM GOAL #3   Title Pt will be independent with HEP in order to improve strength and decrease back pain in order to improve seated tolerance at home    Baseline Pt current states severe LBP during seated posuture of 5-10 mins 10/24: Pt states that he is currently able to sit WNL without any LBP    Time 6    Period Weeks    Status Achieved    Target Date 05/30/21      PT LONG TERM GOAL #4   Title Pt will tolerate 5 mins of standing in // bars with Min A to promote WB and decreased pressure to post pelvic in seated/supine posture    Baseline deferred to later tx. 10/24: 73mns. Pt displayed marked RLE fatigue with visable quad shaking. 11/2: 4 Mins with only occ R UE touch assit and occ wood dowel RUE shoulder flexion to minmic casting a fishing rod.    Time 6    Period Weeks    Status Partially Met    Target Date 07/25/21      PT LONG TERM GOAL #5   Title Pt will amb 60 ft with Quad cane and CGA-MIN A with a gait belt donned for greater access to functional mobility in the home.    Baseline Pt is currently able to amb 20 Ft in the // bars with Mod A and extensive verbal and contact cueing to ensure safe mobility. 11/2: Pt was able to amb 40 ft with quad cane and CGA- Min A with extensive verbal cueing for proper LE sequencing.    Time 6    Period Weeks    Status New    Target Date 07/25/21                   Plan - 06/29/21 1351     Clinical Impression Statement Attempts made to progress to[place] for prolonged standing and walking with SUE support in // bars with AW's donned. Pt remains heavy reliance on R side of body for support with reduced LLE weightbearing. Mulitmodal cuing provided to improve L weightshift and bearing with ambulation but when pt fatigues, there is poor carryover. Further attempts made to ambulate  outside // bars with quad cane with figure 8's. Pt  very unsteady requiring min to modA from PT for safety requiring cues for sequencing of quad cane to improve steadiness and reduce risk of falls. Overall pt limited due to fatigue and pain in R knee. Pt will continue to benefit from skilled PT services to progress indep and safety with ambulation with LRAD.    Personal Factors and Comorbidities Age;Comorbidity 3+    Examination-Activity Limitations Bathing;Reach Overhead;Stairs;Stand;Dressing;Bed Mobility;Hygiene/Grooming;Lift;Transfers;Carry;Locomotion Level    Examination-Participation Restrictions Occupation    Stability/Clinical Decision Making Evolving/Moderate complexity    Rehab Potential Fair    PT Frequency 2x / week    PT Duration 8 weeks    PT Treatment/Interventions ADLs/Self Care Home Management;Biofeedback;Electrical Stimulation;Cryotherapy;Moist Heat;Traction;Ultrasound;DME Instruction;Gait training;Stair training;Functional mobility training;Therapeutic activities;Neuromuscular re-education;Balance training;Therapeutic exercise;Patient/family education;Manual techniques;Wheelchair mobility training;Energy conservation;Dry needling;Passive range of motion;Vestibular;Joint Manipulations;Spinal Manipulations    PT Next Visit Plan Continue gait progression    PT Home Exercise Plan [email or web address]    Consulted and Agree with Plan of Care Patient;Family member/caregiver    Family Member Consulted Mother             Patient will benefit from skilled therapeutic intervention in order to improve the following deficits and impairments:  Abnormal gait, Cardiopulmonary status limiting activity, Decreased balance, Decreased activity tolerance, Decreased cognition, Decreased coordination, Decreased endurance, Decreased knowledge of use of DME, Decreased mobility, Decreased safety awareness, Decreased range of motion, Difficulty walking, Decreased strength, Hypomobility, Hypermobility, Increased muscle spasms, Impaired flexibility, Impaired perceived  functional ability, Impaired tone, Impaired UE functional use, Postural dysfunction, Improper body mechanics, Pain  Visit Diagnosis: Hemiplegia and hemiparesis following cerebral infarction affecting left non-dominant side (HCC)  Muscle weakness (generalized)  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   Salem Caster. Fairly IV, PT, DPT Physical Therapist- [facility]  06/29/2021, 1:58 PM  [place] [facility] [address] [place], [place], [address] Phone: [phone]   Fax:  [phone]  Name: Barry Olson MRN: [id number] Date of Birth: November 24, 1975

## 2021-07-06 ENCOUNTER — Encounter: Payer: Self-pay | Admitting: Physical Therapy

## 2021-07-06 ENCOUNTER — Ambulatory Visit: Payer: Medicaid Other | Admitting: Physical Therapy

## 2021-07-06 ENCOUNTER — Other Ambulatory Visit: Payer: Self-pay

## 2021-07-06 DIAGNOSIS — Z7409 Other reduced mobility: Secondary | ICD-10-CM

## 2021-07-06 DIAGNOSIS — I69354 Hemiplegia and hemiparesis following cerebral infarction affecting left non-dominant side: Secondary | ICD-10-CM | POA: Diagnosis not present

## 2021-07-06 DIAGNOSIS — M6281 Muscle weakness (generalized): Secondary | ICD-10-CM

## 2021-07-06 NOTE — Therapy (Signed)
[facility] [address] [place], [place], [address] Phone: [phone]   Fax:  [phone]  Physical Therapy Treatment  Patient Details  Name: Barry Olson MRN: [id number] Date of Birth: 11-30-1975 Referring Provider (PT): Harrel Lemon, MD   Encounter Date: 07/06/2021   PT End of Session - 07/06/21 1726     Visit Number 15    Number of Visits 16    Date for PT Re-Evaluation 07/15/21    Authorization - Visit Number 5    Authorization - Number of Visits 10    Progress Note Due on Visit 10    PT Start Time 1340    PT Stop Time 1432    PT Time Calculation (min) 52 min    Equipment Utilized During Treatment Gait belt    Activity Tolerance Patient tolerated treatment well;Patient limited by fatigue    Behavior During Therapy [facility] for tasks assessed/performed;Flat affect             Past Medical History:  Diagnosis Date   Back pain     Past Surgical History:  Procedure Laterality Date   HERNIA REPAIR      There were no vitals filed for this visit.   Subjective Assessment - 07/06/21 1725     Subjective Pt reports no new complaints.   Pt. entered PT in w/c.    Pertinent History Strong family support, Pt's mother would like pt to walk again to assist with transfers. Pt would like to return use of LUE. Pt was standing with modified walking when d/c from hospital/rehab last year.    Limitations Walking;Lifting;Standing;House hold activities    How long can you sit comfortably? 5-10 mins    How long can you stand comfortably? Unable due to caregiver fear of falling.    How long can you walk comfortably? Unable    Diagnostic tests x-ray 10/06/20: WNL    Patient Stated Goals Increase use of LUE, Walking and preform transfers with independence.    Currently in Pain? Yes    Pain Score 6     Pain Location Arm    Pain Orientation Left    Pain Onset More than a month ago             There.ex.:  Nustep L3 10 min.  R UE/B LE.  Limited use of L UE due to poor grasp.    Neuro:  Walking in //-bars with CGA 3 laps  Amb. With [facility] and QC in //-bars progressing to gym.  Amb. With increase step length/ BOS while using HW vs. QC.   STS with R armrest/ HW from gray chair 7x.  Amb. From Nustep to car with use of HW.        Pt requires several seated rest breaks due to LE fatigue.      PT Long Term Goals - 06/08/21 1107       PT LONG TERM GOAL #1   Title Pt will increase FOTO score to predicted value of 38 for improvement in self-reported function with ADL    Baseline 12 10/14: 34 11/22: 45 (achieved on the 10th visit)    Time 6    Period Weeks    Status Achieved    Target Date 06/08/21      PT LONG TERM GOAL #2   Title Pt will be Mod I with bed-to-w/c transfers when transfering to the R with sliding board and occasional cueing    Baseline  CGA-minA with therapist. max A-TD with mother. 10/24: Pt was able to complete w/c-to-bed and bed-to-w/c with occ verbal cueing and CGA assist without sliding board 11/2: ISQ from previous assessment in 10/24    Time 6    Period Weeks    Status Partially Met    Target Date 07/25/21      PT LONG TERM GOAL #3   Title Pt will be independent with HEP in order to improve strength and decrease back pain in order to improve seated tolerance at home    Baseline Pt current states severe LBP during seated posuture of 5-10 mins 10/24: Pt states that he is currently able to sit WNL without any LBP    Time 6    Period Weeks    Status Achieved    Target Date 05/30/21      PT LONG TERM GOAL #4   Title Pt will tolerate 5 mins of standing in // bars with Min A to promote WB and decreased pressure to post pelvic in seated/supine posture    Baseline deferred to later tx. 10/24: 16mns. Pt displayed marked RLE fatigue with visable quad shaking. 11/2: 4 Mins with only occ R UE touch assit and occ wood dowel RUE shoulder flexion to minmic casting a fishing rod.    Time 6     Period Weeks    Status Partially Met    Target Date 07/25/21      PT LONG TERM GOAL #5   Title Pt will amb 60 ft with Quad cane and CGA-MIN A with a gait belt donned for greater access to functional mobility in the home.    Baseline Pt is currently able to amb 20 Ft in the // bars with Mod A and extensive verbal and contact cueing to ensure safe mobility. 11/2: Pt was able to amb 40 ft with quad cane and CGA- Min A with extensive verbal cueing for proper LE sequencing.    Time 6    Period Weeks    Status New    Target Date 07/25/21                   Plan - 07/06/21 1426     Clinical Impression Statement Marked increase in BOS/ step pattern while ambulating with use of HW as compared to QC.  1 episode of L knee bucking while walking and pt. able to self-correct and prevent falling.  Significant L shoulder laxity/ sulcus sign and PT discussed use of L hand/wrist/shoulder brace to support UE.  Pt. will continue to benefit from skilled PT services to increase L LE strengthening/ wt. bearing to improve gait pattern/ mod. independence with LRAD.    Personal Factors and Comorbidities Age;Comorbidity 3+    Examination-Activity Limitations Bathing;Reach Overhead;Stairs;Stand;Dressing;Bed Mobility;Hygiene/Grooming;Lift;Transfers;Carry;Locomotion Level    Examination-Participation Restrictions Occupation    Stability/Clinical Decision Making Evolving/Moderate complexity    Clinical Decision Making Moderate    Rehab Potential Fair    PT Frequency 2x / week    PT Duration 8 weeks    PT Treatment/Interventions ADLs/Self Care Home Management;Biofeedback;Electrical Stimulation;Cryotherapy;Moist Heat;Traction;Ultrasound;DME Instruction;Gait training;Stair training;Functional mobility training;Therapeutic activities;Neuromuscular re-education;Balance training;Therapeutic exercise;Patient/family education;Manual techniques;Wheelchair mobility training;Energy conservation;Dry needling;Passive range of  motion;Vestibular;Joint Manipulations;Spinal Manipulations    PT Next Visit Plan Continue gait progression    PT Home Exercise Plan [email or web address]    Consulted and Agree with Plan of Care Patient;Family member/caregiver    Family Member Consulted Mother  Patient will benefit from skilled therapeutic intervention in order to improve the following deficits and impairments:  Abnormal gait, Cardiopulmonary status limiting activity, Decreased balance, Decreased activity tolerance, Decreased cognition, Decreased coordination, Decreased endurance, Decreased knowledge of use of DME, Decreased mobility, Decreased safety awareness, Decreased range of motion, Difficulty walking, Decreased strength, Hypomobility, Hypermobility, Increased muscle spasms, Impaired flexibility, Impaired perceived functional ability, Impaired tone, Impaired UE functional use, Postural dysfunction, Improper body mechanics, Pain  Visit Diagnosis: Hemiplegia and hemiparesis following cerebral infarction affecting left non-dominant side (HCC)  Muscle weakness (generalized)  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  Pura Spice, PT, DPT # [phone] 07/06/2021, 5:36 PM  [facility] [address] [place], [place], [address] Phone: [phone]   Fax:  [phone]  Name: ENDRE COUTTS MRN: [id number] Date of Birth: Jan 03, 1976

## 2021-07-11 ENCOUNTER — Ambulatory Visit: Payer: Medicaid Other | Attending: Internal Medicine | Admitting: Physical Therapy

## 2021-07-11 DIAGNOSIS — M6281 Muscle weakness (generalized): Secondary | ICD-10-CM | POA: Insufficient documentation

## 2021-07-11 DIAGNOSIS — Z7409 Other reduced mobility: Secondary | ICD-10-CM | POA: Insufficient documentation

## 2021-07-11 DIAGNOSIS — I69354 Hemiplegia and hemiparesis following cerebral infarction affecting left non-dominant side: Secondary | ICD-10-CM | POA: Insufficient documentation

## 2021-07-13 ENCOUNTER — Other Ambulatory Visit: Payer: Self-pay

## 2021-07-13 ENCOUNTER — Encounter: Payer: Self-pay | Admitting: Physical Therapy

## 2021-07-13 ENCOUNTER — Ambulatory Visit: Payer: Medicaid Other | Admitting: Physical Therapy

## 2021-07-13 DIAGNOSIS — M6281 Muscle weakness (generalized): Secondary | ICD-10-CM

## 2021-07-13 DIAGNOSIS — I69354 Hemiplegia and hemiparesis following cerebral infarction affecting left non-dominant side: Secondary | ICD-10-CM

## 2021-07-13 DIAGNOSIS — Z7409 Other reduced mobility: Secondary | ICD-10-CM | POA: Diagnosis present

## 2021-07-15 NOTE — Therapy (Signed)
[facility] [address] [place], [place], [address] Phone: [phone]   Fax:  [phone]  Physical Therapy Treatment  Patient Details  Name: Barry Olson MRN: [id number] Date of Birth: 1976/03/02 Referring Provider (PT): Harrel Lemon, MD   Encounter Date: 07/13/2021   Treatment: 16 of 24.  Recert date: 08/08/8784 7672 to 1359   Past Medical History:  Diagnosis Date   Back pain     Past Surgical History:  Procedure Laterality Date   HERNIA REPAIR      There were no vitals filed for this visit.   Pt states L upper thigh sore/hurting today (no subjective pain score given). Pt. entered PT with w/c and then walked from waiting room to gym with use of HW and PT assist for safety. Moderate L lower leg/ foot clonus.      [facility] PT Assessment - 07/15/21 0001       Assessment   Medical Diagnosis left hemiparesis    Referring Provider (PT) Harrel Lemon, MD    Onset Date/Surgical Date 05/29/20    Hand Dominance Right      Home Environment   Living Environment Private residence      Prior Function   Level of Independence Needs assistance with ADLs    Vocation On disability               There.ex.:   Nustep L4 10 min. R UE/B LE.  No UE assist.  Tactile/verbal cuing keep L LE/knee in midline     Neuro:   Walking in //-bars with R UE assist and CGA 3 laps.  Mirror feedback   Amb. With [facility] and //-bars progressing to gym.  Amb. With increase step length/ BOS while using HW.  Pt. Planning to get HW to practice at home with family support.    STS with R armrest/ HW from gray chair 5x.  Ascend/ descend stairs with step to pattern and heavy R UE assist.  R scissoring gait with NBOS during descending.  Tactile assist/ cuing   Amb. From gym to car with use of HW.          Pt requires several seated rest breaks due to LE fatigue.         PT Long Term Goals - 07/15/21 1037       PT LONG TERM GOAL #1    Title Pt will increase FOTO score to predicted value of 38 for improvement in self-reported function with ADL    Baseline 12 10/14: 34 11/22: 45 (achieved on the 10th visit)    Time 6    Period Weeks    Status Achieved    Target Date 06/08/21      PT LONG TERM GOAL #2   Title Pt will be Mod I with bed-to-w/c transfers when transfering to the R with sliding board and occasional cueing    Baseline CGA-minA with therapist. max A-TD with mother. 10/24: Pt was able to complete w/c-to-bed and bed-to-w/c with occ verbal cueing and CGA assist without sliding board 11/2: ISQ from previous assessment in 10/24    Time 6    Period Weeks    Status Achieved    Target Date 07/13/21      PT LONG TERM GOAL #3   Title Pt will be independent with HEP in order to improve strength and decrease back pain in order to improve seated tolerance at home    Baseline Pt current states severe  LBP during seated posuture of 5-10 mins 10/24: Pt states that he is currently able to sit WNL without any LBP    Time 6    Period Weeks    Status Achieved    Target Date 05/30/21      PT LONG TERM GOAL #4   Title Pt will tolerate 5 mins of standing in // bars with Min A to promote WB and decreased pressure to post pelvic in seated/supine posture    Baseline deferred to later tx. 10/24: 38mns. Pt displayed marked RLE fatigue with visable quad shaking. 11/2: 4 Mins with only occ R UE touch assit and occ wood dowel RUE shoulder flexion to minmic casting a fishing rod.    Time 6    Period Weeks    Status Partially Met    Target Date 08/10/21      PT LONG TERM GOAL #5   Title Pt will amb 100 ft with HW and CGA-MIN A with a gait belt donned for greater access to functional mobility in the home.    Baseline Pt is currently able to amb 20 Ft in the // bars with Mod A and extensive verbal and contact cueing to ensure safe mobility. 11/2: Pt was able to amb 40 ft with quad cane and CGA- Min A with extensive verbal cueing for proper  LE sequencing.    Time 6    Period Weeks    Status Partially Met    Target Date 08/10/21      Additional Long Term Goals   Additional Long Term Goals Yes      PT LONG TERM GOAL #6   Title Pt. able to ascend/ descend 4 stairs with use of R UE assist/ handrail and step pattern with min. A safely to improve mobility outside of house.    Baseline Mod./max. A to ascend/ descend stairs with step to pattern.  Scissoring gait    Time 4    Period Weeks    Status New    Target Date 08/10/21                Plan - 07/15/21 1028     Clinical Impression Statement Tx focus on standing tolerance with proper upright posture and gait training with use of HW.  Pt. benefits from use of HW as compared to QC for a more consistent recip. step pattern and heavy R UE support.  Pt requires heavy reliance on R side of body for support with reduced LLE weightbearing. Mulitmodal cuing provided to improve L weightshift and bearing with ambulation but when pt fatigues, there is poor carryover.   Pt. benefits from min. A from PT for safety requiring cues for sequencing of HW to improve steadiness and reduce risk of falls outside of //-bars. Overall pt limited due to fatigue and pain in R knee.  Mod. A to ascend/ descend stairs with step to pattern and heavy R UE assist on handrail.  Pt will continue to benefit from skilled PT services to progress indep and safety with ambulation with LRAD.    Personal Factors and Comorbidities Age;Comorbidity 3+    Examination-Activity Limitations Bathing;Reach Overhead;Stairs;Stand;Dressing;Bed Mobility;Hygiene/Grooming;Lift;Transfers;Carry;Locomotion Level    Examination-Participation Restrictions Occupation    Stability/Clinical Decision Making Evolving/Moderate complexity    Clinical Decision Making Moderate    Rehab Potential Fair    PT Frequency 2x / week    PT Duration 4 weeks    PT Treatment/Interventions ADLs/Self Care Home Management;Biofeedback;Electrical  Stimulation;Cryotherapy;Moist Heat;Traction;Ultrasound;DME Instruction;Gait training;Stair  training;Functional mobility training;Therapeutic activities;Neuromuscular re-education;Balance training;Therapeutic exercise;Patient/family education;Manual techniques;Wheelchair mobility training;Energy conservation;Dry needling;Passive range of motion;Vestibular;Joint Manipulations;Spinal Manipulations    PT Next Visit Plan Continue gait progression    PT Home Exercise Plan [email or web address]    Consulted and Agree with Plan of Care Patient;Family member/caregiver    Family Member Consulted Mother             Patient will benefit from skilled therapeutic intervention in order to improve the following deficits and impairments:  Abnormal gait, Cardiopulmonary status limiting activity, Decreased balance, Decreased activity tolerance, Decreased cognition, Decreased coordination, Decreased endurance, Decreased knowledge of use of DME, Decreased mobility, Decreased safety awareness, Decreased range of motion, Difficulty walking, Decreased strength, Hypomobility, Hypermobility, Increased muscle spasms, Impaired flexibility, Impaired perceived functional ability, Impaired tone, Impaired UE functional use, Postural dysfunction, Improper body mechanics, Pain  Visit Diagnosis: Hemiplegia and hemiparesis following cerebral infarction affecting left non-dominant side (HCC)  Muscle weakness (generalized)  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  Pura Spice, PT, DPT # [id number] 07/15/2021, 10:42 AM  [facility] [address] [place], [place], [address] Phone: [phone]   Fax:  [phone]  Name: JURIEL CID MRN: [id number] Date of Birth: [birth year]

## 2021-07-20 ENCOUNTER — Ambulatory Visit: Payer: Medicaid Other | Admitting: Physical Therapy

## 2021-07-27 ENCOUNTER — Encounter: Payer: Medicaid Other | Admitting: Physical Therapy

## 2021-08-03 ENCOUNTER — Encounter: Payer: Medicaid Other | Admitting: Physical Therapy

## 2021-08-10 ENCOUNTER — Ambulatory Visit: Payer: Medicaid Other | Admitting: Physical Therapy

## 2021-08-15 ENCOUNTER — Encounter: Payer: Self-pay | Admitting: Physical Therapy

## 2021-08-15 ENCOUNTER — Ambulatory Visit: Payer: Medicaid Other | Attending: Internal Medicine | Admitting: Physical Therapy

## 2021-08-15 ENCOUNTER — Other Ambulatory Visit: Payer: Self-pay

## 2021-08-15 DIAGNOSIS — Z7409 Other reduced mobility: Secondary | ICD-10-CM | POA: Insufficient documentation

## 2021-08-15 DIAGNOSIS — M6281 Muscle weakness (generalized): Secondary | ICD-10-CM | POA: Diagnosis present

## 2021-08-15 DIAGNOSIS — I69354 Hemiplegia and hemiparesis following cerebral infarction affecting left non-dominant side: Secondary | ICD-10-CM | POA: Insufficient documentation

## 2021-08-15 NOTE — Therapy (Signed)
[place] [facility] [address] [place], [place], [address] Phone: [phone]   Fax:  [phone]  Physical Therapy Treatment  Patient Details  Name: Barry Olson MRN: [id number] Date of Birth: [birth year] Referring Provider (PT): Harrel Lemon, MD   Encounter Date: 08/15/2021   PT End of Session - 08/15/21 2030     Visit Number 17    Number of Visits 33    Date for PT Re-Evaluation 10/10/21    Authorization Time Period CAID: 1 of 8 (09/01/21)    Authorization - Visit Number 7    Authorization - Number of Visits 10    Progress Note Due on Visit 10    PT Start Time 1257    PT Stop Time 1358    PT Time Calculation (min) 61 min    Equipment Utilized During Treatment Gait belt    Activity Tolerance Patient tolerated treatment well;Patient limited by pain    Behavior During Therapy WFL for tasks assessed/performed;Flat affect             Past Medical History:  Diagnosis Date   Back pain     Past Surgical History:  Procedure Laterality Date   HERNIA REPAIR      There were no vitals filed for this visit.   Subjective Assessment - 08/15/21 1303     Subjective Pt. reports significant R knee pain with standing/ walking.  Pt. states he hasn't exercised or walked since last PT appt. in early December.    Pertinent History Strong family support, Pt's mother would like pt to walk again to assist with transfers. Pt would like to return use of LUE. Pt was standing with modified walking when d/c from hospital/rehab last year.    Limitations Walking;Lifting;Standing;House hold activities    How long can you sit comfortably? 5-10 mins    How long can you stand comfortably? Unable due to caregiver fear of falling.    How long can you walk comfortably? Unable    Diagnostic tests x-ray 10/06/20: WNL    Patient Stated Goals Increase use of LUE, Walking and preform transfers with independence.    Currently in Pain? Yes    Pain Score 6      Pain Location Knee    Pain Orientation Right    Pain Onset More than a month ago               There.ex.:   Nustep L3-4 10 min. R UE/B LE.  No UE assist.  Tactile/verbal cuing keep L LE/knee in midline   Seated on mat table: L hip flexion/ abduction/ adduction 10x.  Reassessment of L hip flexor strength 4+/5 MMT.  Supine marching (cuing to maintain LE in midline)/ hip abduction/ adduction isometric 10x/ bridging (limited after 5x secondary to LBP).       Neuro:  Standing tolerance (6 min.)- functional reaching to cones (cross body with R UE).     Walking in //-bars with R UE assist and CGA 3 laps.  Mirror feedback   Amb. With [facility] and //-bars progressing to gym.  Amb. With increase step length/ BOS while using HW.     STS with R armrest/ HW from gray chair 5x.  STS from mat table to [facility].     Ascend/ descend stairs with step to pattern and heavy R UE assist.  R scissoring gait with NBOS during descending. Tactile assist/ cuing   Amb. From gym to car with use of HW.  Pt requires several seated rest breaks due to LE fatigue/ R knee pain.        PT Long Term Goals - 08/15/21 2037       PT LONG TERM GOAL #1   Title Pt will increase FOTO score to predicted value of 38 for improvement in self-reported function with ADL    Baseline 12 10/14: 34 11/22: 45 (achieved on the 10th visit)    Time 6    Period Weeks    Status Achieved    Target Date 06/08/21      PT LONG TERM GOAL #2   Title Pt will be Mod I with bed-to-w/c transfers when transfering to the R with sliding board and occasional cueing    Baseline CGA-minA with therapist. max A-TD with mother. 10/24: Pt was able to complete w/c-to-bed and bed-to-w/c with occ verbal cueing and CGA assist without sliding board 11/2: ISQ from previous assessment in 10/24    Time 6    Period Weeks    Status Achieved    Target Date 07/13/21      PT LONG TERM GOAL #3   Title Pt will be independent with HEP in order to  improve strength and decrease back pain in order to improve seated tolerance at home    Baseline Pt current states severe LBP during seated posuture of 5-10 mins 10/24: Pt states that he is currently able to sit WNL without any LBP    Time 6    Period Weeks    Status Achieved    Target Date 05/30/21      PT LONG TERM GOAL #4   Title Pt will tolerate 5 mins of standing in // bars with Min A to promote WB and decreased pressure to post pelvic in seated/supine posture    Baseline deferred to later tx. 10/24: 70mns. Pt displayed marked RLE fatigue with visable quad shaking. 11/2: 4 Mins with only occ R UE touch assit and occ wood dowel RUE shoulder flexion to minmic casting a fishing rod.    Time 6    Period Weeks    Status Achieved    Target Date 08/15/21      PT LONG TERM GOAL #5   Title Pt will amb 100 ft with HW and CGA-MIN A with a gait belt donned for greater access to functional mobility in the home.    Baseline Pt is currently able to amb 20 Ft in the // bars with Mod A and extensive verbal and contact cueing to ensure safe mobility. 11/2: Pt was able to amb 40 ft with quad cane and CGA- Min A with extensive verbal cueing for proper LE sequencing.  1/9:  [facility]  Mod. A for safety/ BOS    Time 8    Period Weeks    Status Partially Met    Target Date 10/10/21      Additional Long Term Goals   Additional Long Term Goals Yes      PT LONG TERM GOAL #6   Title Pt. able to ascend/ descend 4 stairs with use of R UE assist/ handrail and step pattern with min. A safely to improve mobility outside of house.    Baseline Mod./max. A to ascend/ descend stairs with step to pattern.  Scissoring gait esp. with descending.    Time 8    Period Weeks    Status Partially Met    Target Date 10/10/21  PT LONG TERM GOAL #7   Title Pt. will be able to stand from chair with proper technique/ use of HW and maintain upright standing posture with SBA/mod. I to improve  safety with transfers.    Baseline Pt. requires heavy UE assist (pulling on //-bars) to stand upright.  Limited currently with HW without PT assist.    Time 8    Period Weeks    Status New    Target Date 10/10/21                   Plan - 08/15/21 2033     Clinical Impression Statement Today's tx was focused on ambulation with HW outside of the bars for greater distances, step pattern and proper BOS.  PT reassessed all goals and pt. improved standing tolerance/ gait.  No changes in L shoulder/hand ROM.  Sulcus Sign: + 2 finger consistent with baseline. PROM Shoulder flexion: consistent with baseline.  Pt. requires min./ mod. A for safe transfers and ambulation with use of HW in clinic. Pt. ascends/ descends stairs with step to pattern and heavy R UE assist for safety.  Mod. A from PT, esp. with descending secondary to L LE scissoring gait.   Pt continues to display mark L LE/UE endurance, strength, and ROM deficits resulting in decreased safe home/community mobiity, increased pain, and limited access to QOL. Pt. will continue to benefit from skilled physical therapy to progress POC to address remaining deficits to facilitate maximum functional capacity for optimal personal health and wellness for ADLs.    Personal Factors and Comorbidities Age;Comorbidity 3+    Examination-Activity Limitations Bathing;Reach Overhead;Stairs;Stand;Dressing;Bed Mobility;Hygiene/Grooming;Lift;Transfers;Carry;Locomotion Level    Examination-Participation Restrictions Occupation    Stability/Clinical Decision Making Evolving/Moderate complexity    Rehab Potential Fair    PT Frequency 2x / week    PT Duration 8 weeks    PT Treatment/Interventions ADLs/Self Care Home Management;Biofeedback;Electrical Stimulation;Cryotherapy;Moist Heat;Traction;Ultrasound;DME Instruction;Gait training;Stair training;Functional mobility training;Therapeutic activities;Neuromuscular re-education;Balance training;Therapeutic  exercise;Patient/family education;Manual techniques;Wheelchair mobility training;Energy conservation;Dry needling;Passive range of motion;Vestibular;Joint Manipulations;Spinal Manipulations    PT Next Visit Plan Continue gait progression    PT Home Exercise Plan [email or web address]    Consulted and Agree with Plan of Care Patient;Family member/caregiver    Family Member Consulted Mother             Patient will benefit from skilled therapeutic intervention in order to improve the following deficits and impairments:  Abnormal gait, Cardiopulmonary status limiting activity, Decreased balance, Decreased activity tolerance, Decreased cognition, Decreased coordination, Decreased endurance, Decreased knowledge of use of DME, Decreased mobility, Decreased safety awareness, Decreased range of motion, Difficulty walking, Decreased strength, Hypomobility, Hypermobility, Increased muscle spasms, Impaired flexibility, Impaired perceived functional ability, Impaired tone, Impaired UE functional use, Postural dysfunction, Improper body mechanics, Pain  Visit Diagnosis: Hemiplegia and hemiparesis following cerebral infarction affecting left non-dominant side (HCC)  Muscle weakness (generalized)  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  Pura Spice, PT, DPT # [phone] 08/15/2021, 8:42 PM  [place] [address] [place], [place], [address] Phone: [phone]   Fax:  [phone]  Name: Barry Olson MRN: [id number] Date of Birth: 09-20-1975

## 2021-08-17 ENCOUNTER — Ambulatory Visit: Payer: Medicaid Other | Admitting: Physical Therapy

## 2021-08-17 ENCOUNTER — Encounter: Payer: Self-pay | Admitting: Physical Therapy

## 2021-08-17 ENCOUNTER — Other Ambulatory Visit: Payer: Self-pay

## 2021-08-17 DIAGNOSIS — Z7409 Other reduced mobility: Secondary | ICD-10-CM

## 2021-08-17 DIAGNOSIS — I69354 Hemiplegia and hemiparesis following cerebral infarction affecting left non-dominant side: Secondary | ICD-10-CM

## 2021-08-17 DIAGNOSIS — M6281 Muscle weakness (generalized): Secondary | ICD-10-CM

## 2021-08-17 NOTE — Therapy (Signed)
[place] [facility] [address] [place], [place], [address] Phone: [phone]   Fax:  [phone]  Physical Therapy Treatment  Patient Details  Name: Barry Olson MRN: [id number] Date of Birth: September 19, 1975 Referring Provider (PT): Harrel Lemon, MD   Encounter Date: 08/17/2021   PT End of Session - 08/17/21 1444     Visit Number 18    Number of Visits 33    Date for PT Re-Evaluation 10/10/21    Authorization Time Period CAID: 2 of 8 (09/01/21)    Authorization - Visit Number 8    Authorization - Number of Visits 10    Progress Note Due on Visit 10    PT Start Time 1255    PT Stop Time 1359    PT Time Calculation (min) 64 min    Equipment Utilized During Treatment Gait belt    Activity Tolerance Patient tolerated treatment well;Patient limited by pain    Behavior During Therapy [facility] for tasks assessed/performed;Flat affect             Past Medical History:  Diagnosis Date   Back pain     Past Surgical History:  Procedure Laterality Date   HERNIA REPAIR      There were no vitals filed for this visit.   Subjective Assessment - 08/17/21 1443     Subjective Pt. c/o B knee discomfort/ fatigue prior to tx. session.    Pertinent History Strong family support, Pt's mother would like pt to walk again to assist with transfers. Pt would like to return use of LUE. Pt was standing with modified walking when d/c from hospital/rehab last year.    Limitations Walking;Lifting;Standing;House hold activities    How long can you sit comfortably? 5-10 mins    How long can you stand comfortably? Unable due to caregiver fear of falling.    How long can you walk comfortably? Unable    Diagnostic tests x-ray 10/06/20: WNL    Patient Stated Goals Increase use of LUE, Walking and preform transfers with independence.    Currently in Pain? Yes   no subjective pain score   Pain Onset More than a month ago                There.ex.:    Nustep L4 10 min. R UE/B LE.   Tactile/verbal cuing keep L LE/knee in midline    Supine passive hip flexion/ adduction stretch 2x30 secs  Supine SAQ 2x10 Supine ball squeeze 2x8 Supine clam shell 2x8 STS with assisted device limited to 2 reps due to R knee fatigue and pain  Neuro:    Amb. With [facility] from waiting room to gym.  Amb. With increase step length/ BOS while using HW. Min-mod. A for safety and cuing.      Walking in //-bars with R UE assist and CGA 3 laps. Moderate cuing to increase BOS/ R step length during L LE stance phase of gait.  Mirror feedback for posture correction.    Standing/ walking cone taps in //-bars.  Mod. A for safety.  1 episode of L knee buckling and pt. Required PT assist to return to upright posture.  Standing wt. Shifting in //-bars with heavy R UE assist.      STS with R armrest (mod. A and cuing from PT for proper/ safe technique).    Amb. From gym to car with use of HW.          Pt requires several  seated rest breaks due to LE fatigue/ R knee pain.      PT Long Term Goals - 08/15/21 2037       PT LONG TERM GOAL #1   Title Pt will increase FOTO score to predicted value of 38 for improvement in self-reported function with ADL    Baseline 12 10/14: 34 11/22: 45 (achieved on the 10th visit)    Time 6    Period Weeks    Status Achieved    Target Date 06/08/21      PT LONG TERM GOAL #2   Title Pt will be Mod I with bed-to-w/c transfers when transfering to the R with sliding board and occasional cueing    Baseline CGA-minA with therapist. max A-TD with mother. 10/24: Pt was able to complete w/c-to-bed and bed-to-w/c with occ verbal cueing and CGA assist without sliding board 11/2: ISQ from previous assessment in 10/24    Time 6    Period Weeks    Status Achieved    Target Date 07/13/21      PT LONG TERM GOAL #3   Title Pt will be independent with HEP in order to improve strength and decrease back pain in order to improve seated tolerance at  home    Baseline Pt current states severe LBP during seated posuture of 5-10 mins 10/24: Pt states that he is currently able to sit WNL without any LBP    Time 6    Period Weeks    Status Achieved    Target Date 05/30/21      PT LONG TERM GOAL #4   Title Pt will tolerate 5 mins of standing in // bars with Min A to promote WB and decreased pressure to post pelvic in seated/supine posture    Baseline deferred to later tx. 10/24: 92mns. Pt displayed marked RLE fatigue with visable quad shaking. 11/2: 4 Mins with only occ R UE touch assit and occ wood dowel RUE shoulder flexion to minmic casting a fishing rod.    Time 6    Period Weeks    Status Achieved    Target Date 08/15/21      PT LONG TERM GOAL #5   Title Pt will amb 100 ft with HW and CGA-MIN A with a gait belt donned for greater access to functional mobility in the home.    Baseline Pt is currently able to amb 20 Ft in the // bars with Mod A and extensive verbal and contact cueing to ensure safe mobility. 11/2: Pt was able to amb 40 ft with quad cane and CGA- Min A with extensive verbal cueing for proper LE sequencing.  1/9:  [facility]  Mod. A for safety/ BOS    Time 8    Period Weeks    Status Partially Met    Target Date 10/10/21      Additional Long Term Goals   Additional Long Term Goals Yes      PT LONG TERM GOAL #6   Title Pt. able to ascend/ descend 4 stairs with use of R UE assist/ handrail and step pattern with min. A safely to improve mobility outside of house.    Baseline Mod./max. A to ascend/ descend stairs with step to pattern.  Scissoring gait esp. with descending.    Time 8    Period Weeks    Status Partially Met    Target Date 10/10/21      PT LONG TERM  GOAL #7   Title Pt. will be able to stand from chair with proper technique/ use of HW and maintain upright standing posture with SBA/mod. I to improve safety with transfers.    Baseline Pt. requires heavy UE assist (pulling on  //-bars) to stand upright.  Limited currently with HW without PT assist.    Time 8    Period Weeks    Status New    Target Date 10/10/21                   Plan - 08/17/21 1416     Clinical Impression Statement Pt. reported some bilateral knee fatigue from previous session. Pt. ambulates with HW and requires mod. A for transfers. Pt. was able to ambulate in //-bars with mod. R UE assist and had one episode of knee buckling. Pt. is able to perform tasks and exercises with proper form but knee and fatigue startedo play a role in how safely/ comfortably the exercises can be performed. Pt. will continue to work on comfort with [place] around the clinic and strengthen LE to allow for a strong BOS while ambulating. Pt was able to ambulate with HW towards his vehicle but needed wheelchair due to fatigue.    Personal Factors and Comorbidities Age;Comorbidity 3+    Examination-Activity Limitations Bathing;Reach Overhead;Stairs;Stand;Dressing;Bed Mobility;Hygiene/Grooming;Lift;Transfers;Carry;Locomotion Level    Examination-Participation Restrictions Occupation    Stability/Clinical Decision Making Evolving/Moderate complexity    Clinical Decision Making Moderate    Rehab Potential Fair    PT Frequency 2x / week    PT Duration 8 weeks    PT Treatment/Interventions ADLs/Self Care Home Management;Biofeedback;Electrical Stimulation;Cryotherapy;Moist Heat;Traction;Ultrasound;DME Instruction;Gait training;Stair training;Functional mobility training;Therapeutic activities;Neuromuscular re-education;Balance training;Therapeutic exercise;Patient/family education;Manual techniques;Wheelchair mobility training;Energy conservation;Dry needling;Passive range of motion;Vestibular;Joint Manipulations;Spinal Manipulations    PT Next Visit Plan Continue gait progression    PT Home Exercise Plan [email or web address]    Consulted and Agree with Plan of Care Patient;Family member/caregiver    Family Member Consulted Mother              Patient will benefit from skilled therapeutic intervention in order to improve the following deficits and impairments:  Abnormal gait, Cardiopulmonary status limiting activity, Decreased balance, Decreased activity tolerance, Decreased cognition, Decreased coordination, Decreased endurance, Decreased knowledge of use of DME, Decreased mobility, Decreased safety awareness, Decreased range of motion, Difficulty walking, Decreased strength, Hypomobility, Hypermobility, Increased muscle spasms, Impaired flexibility, Impaired perceived functional ability, Impaired tone, Impaired UE functional use, Postural dysfunction, Improper body mechanics, Pain  Visit Diagnosis: Hemiplegia and hemiparesis following cerebral infarction affecting left non-dominant side (HCC)  Muscle weakness (generalized)  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  Pura Spice, PT, DPT # [phone] 08/17/2021, 2:45 PM  [place] [facility] [place] [address] [place], [place], [address] Phone: [phone]   Fax:  [phone]  Name: Barry Olson MRN: [id number] Date of Birth: 04/29/1976

## 2021-08-22 ENCOUNTER — Ambulatory Visit: Payer: Medicaid Other | Admitting: Physical Therapy

## 2021-08-22 ENCOUNTER — Encounter: Payer: Self-pay | Admitting: Physical Therapy

## 2021-08-22 ENCOUNTER — Other Ambulatory Visit: Payer: Self-pay

## 2021-08-22 DIAGNOSIS — I69354 Hemiplegia and hemiparesis following cerebral infarction affecting left non-dominant side: Secondary | ICD-10-CM

## 2021-08-22 DIAGNOSIS — M6281 Muscle weakness (generalized): Secondary | ICD-10-CM

## 2021-08-22 NOTE — Therapy (Addendum)
[facility] [place] [facility] [address] [place], [place], [address] Phone: [phone]   Fax:  [phone]  Physical Therapy Treatment  Patient Details  Name: Barry Olson MRN: [id number] Date of Birth: 1975/12/10 Referring Provider (PT): Harrel Lemon, MD   Encounter Date: 08/22/2021   PT End of Session - 08/22/21 1310     Visit Number 19    Number of Visits 33    Date for PT Re-Evaluation 10/10/21    Authorization Time Period CAID: 3 of 8 (09/01/21)    Authorization - Visit Number 9    Authorization - Number of Visits 10    Progress Note Due on Visit 10    PT Start Time 1300    PT Stop Time 1404    PT Time Calculation (min) 64 min    Equipment Utilized During Treatment Gait belt    Activity Tolerance Patient tolerated treatment well;Patient limited by pain    Behavior During Therapy WFL for tasks assessed/performed;Flat affect             Past Medical History:  Diagnosis Date   Back pain     Past Surgical History:  Procedure Laterality Date   HERNIA REPAIR      There were no vitals filed for this visit.   Subjective Assessment - 08/22/21 1304     Subjective Pt. states he has some left leg pain and right knee pain. Pt. says he was not able to sleep comfortably the past few nights.    Pertinent History Strong family support, Pt's mother would like pt to walk again to assist with transfers. Pt would like to return use of LUE. Pt was standing with modified walking when d/c from hospital/rehab last year.    Limitations Walking;Lifting;Standing;House hold activities    How long can you sit comfortably? 5-10 mins    How long can you stand comfortably? Unable due to caregiver fear of falling.    How long can you walk comfortably? Unable    Diagnostic tests x-ray 10/06/20: WNL    Patient Stated Goals Increase use of LUE, Walking and preform transfers with independence.    Currently in Pain? Yes    Pain Location Leg    Pain  Onset More than a month ago                There.ex.:   Nustep L4 8 min. R UE/B LE.   Tactile/verbal cuing keep L LE/knee in midline     Supine SAQ 2x10 Supine ball squeeze 1x8 limited by pain  Supine clam shell 1x8 limited by pain    Neuro:    Amb. With [facility] from waiting room to gym.  Amb. With increase step length/ BOS while using HW. Min-mod. A for safety and cuing.       Walking in //-bars with R UE assist and CGA 3 laps. Moderate cuing to increase BOS/ R step length during L LE stance phase of gait.      Standing wt. Shifts in //-bars with heavy R UE assist. Pt. Is able to bear wt. On L LE after cueing to keep feet shoulder width apart.   Standing and reaching for cones in //-bars. PT mod. A assist with no UE assist.      STS with R armrest 5x (mod. A and cuing from PT for proper/ safe technique). Pt. Is showing improvement from last session as the reps are a lot smoother and required less assistance from  the PT.      Amb. From gym to car with use of HW.  Pt. Was able to transfer to car with the use of HW.        PT Long Term Goals - 08/15/21 2037       PT LONG TERM GOAL #1   Title Pt will increase FOTO score to predicted value of 38 for improvement in self-reported function with ADL    Baseline 12 10/14: 34 11/22: 45 (achieved on the 10th visit)    Time 6    Period Weeks    Status Achieved    Target Date 06/08/21      PT LONG TERM GOAL #2   Title Pt will be Mod I with bed-to-w/c transfers when transfering to the R with sliding board and occasional cueing    Baseline CGA-minA with therapist. max A-TD with mother. 10/24: Pt was able to complete w/c-to-bed and bed-to-w/c with occ verbal cueing and CGA assist without sliding board 11/2: ISQ from previous assessment in 10/24    Time 6    Period Weeks    Status Achieved    Target Date 07/13/21      PT LONG TERM GOAL #3   Title Pt will be independent with HEP in order to improve strength and decrease back pain  in order to improve seated tolerance at home    Baseline Pt current states severe LBP during seated posuture of 5-10 mins 10/24: Pt states that he is currently able to sit WNL without any LBP    Time 6    Period Weeks    Status Achieved    Target Date 05/30/21      PT LONG TERM GOAL #4   Title Pt will tolerate 5 mins of standing in // bars with Min A to promote WB and decreased pressure to post pelvic in seated/supine posture    Baseline deferred to later tx. 10/24: 12mins. Pt displayed marked RLE fatigue with visable quad shaking. 11/2: 4 Mins with only occ R UE touch assit and occ wood dowel RUE shoulder flexion to minmic casting a fishing rod.    Time 6    Period Weeks    Status Achieved    Target Date 08/15/21      PT LONG TERM GOAL #5   Title Pt will amb 100 ft with HW and CGA-MIN A with a gait belt donned for greater access to functional mobility in the home.    Baseline Pt is currently able to amb 20 Ft in the // bars with Mod A and extensive verbal and contact cueing to ensure safe mobility. 11/2: Pt was able to amb 40 ft with quad cane and CGA- Min A with extensive verbal cueing for proper LE sequencing.  1/9:  pt. benefits from [facility] with gait in clinic.  Mod. A for safety/ BOS    Time 8    Period Weeks    Status Partially Met    Target Date 10/10/21      Additional Long Term Goals   Additional Long Term Goals Yes      PT LONG TERM GOAL #6   Title Pt. able to ascend/ descend 4 stairs with use of R UE assist/ handrail and step pattern with min. A safely to improve mobility outside of house.    Baseline Mod./max. A to ascend/ descend stairs with step to pattern.  Scissoring gait esp. with descending.    Time 8    Period  Weeks    Status Partially Met    Target Date 10/10/21      PT LONG TERM GOAL #7   Title Pt. will be able to stand from chair with proper technique/ use of HW and maintain upright standing posture with SBA/mod. I to improve safety with transfers.    Baseline Pt.  requires heavy UE assist (pulling on //-bars) to stand upright.  Limited currently with HW without PT assist.    Time 8    Period Weeks    Status New    Target Date 10/10/21                   Plan - 08/22/21 1326     Clinical Impression Statement Pt. reports LE pain at the beginning of the session. Pt. is able to ambulate with HW and still requires mod. A for transfers. Pt. is able to get more comfortable with STS and ambulation with HW but does need consistent cueing to take wide steps with L LE to increase BOS. Pt. is motviated and looking to get a HW for home ambulation and wants to work on his ambulation ability. Pt. will continue to benefit from practice with a HW as well as cueing for his BOS. Pt. is showing improvement and was able to ambulate from clinic to his vehicle and had a good support system who was excited to see some improvement.    Personal Factors and Comorbidities Age;Comorbidity 3+    Examination-Activity Limitations Bathing;Reach Overhead;Stairs;Stand;Dressing;Bed Mobility;Hygiene/Grooming;Lift;Transfers;Carry;Locomotion Level    Examination-Participation Restrictions Occupation    Stability/Clinical Decision Making Evolving/Moderate complexity    Clinical Decision Making Moderate    Rehab Potential Fair    PT Frequency 2x / week    PT Duration 8 weeks    PT Treatment/Interventions ADLs/Self Care Home Management;Biofeedback;Electrical Stimulation;Cryotherapy;Moist Heat;Traction;Ultrasound;DME Instruction;Gait training;Stair training;Functional mobility training;Therapeutic activities;Neuromuscular re-education;Balance training;Therapeutic exercise;Patient/family education;Manual techniques;Wheelchair mobility training;Energy conservation;Dry needling;Passive range of motion;Vestibular;Joint Manipulations;Spinal Manipulations    PT Next Visit Plan Continue gait progression    PT Home Exercise Plan [email or web address]    Consulted and Agree with Plan of Care Patient;Family  member/caregiver    Family Member Consulted Mother             Patient will benefit from skilled therapeutic intervention in order to improve the following deficits and impairments:  Abnormal gait, Cardiopulmonary status limiting activity, Decreased balance, Decreased activity tolerance, Decreased cognition, Decreased coordination, Decreased endurance, Decreased knowledge of use of DME, Decreased mobility, Decreased safety awareness, Decreased range of motion, Difficulty walking, Decreased strength, Hypomobility, Hypermobility, Increased muscle spasms, Impaired flexibility, Impaired perceived functional ability, Impaired tone, Impaired UE functional use, Postural dysfunction, Improper body mechanics, Pain  Visit Diagnosis: Hemiplegia and hemiparesis following cerebral infarction affecting left non-dominant side (HCC)  Muscle weakness (generalized)  Spastic hemiplegia of left nondominant side as late effect of cerebral infarction [facility])     Problem List There are no problems to display for this patient.  Pura Spice, PT, DPT # [id number] Cleopatra Cedar, SPT 08/22/2021, 2:53 PM  [facility] [address] [place], [place], [address] Phone: [phone]   Fax:  [phone]  Name: MERIK MIGNANO MRN: [id number] Date of Birth: October 18, 1975

## 2021-08-24 ENCOUNTER — Other Ambulatory Visit: Payer: Self-pay

## 2021-08-24 ENCOUNTER — Ambulatory Visit: Payer: Medicaid Other | Admitting: Physical Therapy

## 2021-08-24 DIAGNOSIS — I69354 Hemiplegia and hemiparesis following cerebral infarction affecting left non-dominant side: Secondary | ICD-10-CM

## 2021-08-24 DIAGNOSIS — M6281 Muscle weakness (generalized): Secondary | ICD-10-CM

## 2021-08-24 NOTE — Therapy (Addendum)
[facility] [address] [place], [place], [address] Phone: [phone]   Fax:  [phone]  Physical Therapy Treatment Physical Therapy Progress Note   Dates of reporting period  06/15/21  to  08/24/2021  Patient Details  Name: Barry Olson MRN: [id number] Date of Birth: January 20, 1976 Referring Provider (PT): Harrel Lemon, MD   Encounter Date: 08/24/2021   PT End of Session - 08/24/21 1251     Visit Number 20    Number of Visits 33    Date for PT Re-Evaluation 10/10/21    Authorization Time Period CAID: 4 of 8 (09/01/21)    Authorization - Visit Number 10    Authorization - Number of Visits 10    Progress Note Due on Visit 10    PT Start Time 1245    PT Stop Time 1340    PT Time Calculation (min) 55 min    Equipment Utilized During Treatment Gait belt    Activity Tolerance Patient tolerated treatment well;Patient limited by pain    Behavior During Therapy [facility] for tasks assessed/performed;Flat affect             Past Medical History:  Diagnosis Date   Back pain     Past Surgical History:  Procedure Laterality Date   HERNIA REPAIR      There were no vitals filed for this visit.   Subjective Assessment - 08/24/21 1246     Subjective Pt. is having some R knee pain. Pt. ordered a HW for household/community ambulation and is excited to start practicing at home. Pt. also stated he has been sleeping better the past few nights.    Pertinent History Strong family support, Pt's mother would like pt to walk again to assist with transfers. Pt would like to return use of LUE. Pt was standing with modified walking when d/c from hospital/rehab last year.    Limitations Walking;Lifting;Standing;House hold activities    How long can you sit comfortably? 5-10 mins    How long can you stand comfortably? Unable due to caregiver fear of falling.    How long can you walk comfortably? Unable    Diagnostic tests x-ray 10/06/20: WNL     Patient Stated Goals Increase use of LUE, Walking and preform transfers with independence.    Currently in Pain? Yes    Pain Score 4     Pain Location Knee    Pain Orientation Right    Pain Onset More than a month ago               There.ex.:   Nustep L4 8 min. R UE/B LE.   Tactile/verbal cuing keep L LE/knee in midline     Sitting LAQ 2x10 with PT assist  Supine ball squeeze 1x8 limited by pain  Supine clam shell  GTB 1x8 limited by pain     Neuro:    Amb. With [facility] [address] from waiting room to gym.  Amb. With increase step length/ BOS while using HW. Min-mod. A for safety and cuing.       Walking in //-bars with R UE assist and CGA 4 laps. Moderate cuing to increase BOS/ R step length during L LE stance phase of gait.       Standing wt. Shifts in //-bars with heavy R UE assist. Pt. Is able to bear wt. On L LE after cueing to keep feet shoulder width apart.    Standing and reaching for cones in //-bars. PT  mod. A assist with no UE assist.      STS with R armrest 5x (mod. A and cuing from PT for proper/ safe technique). Pt. Is showing improvement from last session as the reps are a lot smoother and required less assistance from the PT.         PT Long Term Goals - 08/15/21 2037       PT LONG TERM GOAL #1   Title Pt will increase FOTO score to predicted value of 38 for improvement in self-reported function with ADL    Baseline 12 10/14: 34 11/22: 45 (achieved on the 10th visit)    Time 6    Period Weeks    Status Achieved    Target Date 06/08/21      PT LONG TERM GOAL #2   Title Pt will be Mod I with bed-to-w/c transfers when transfering to the R with sliding board and occasional cueing    Baseline CGA-minA with therapist. max A-TD with mother. 10/24: Pt was able to complete w/c-to-bed and bed-to-w/c with occ verbal cueing and CGA assist without sliding board 11/2: ISQ from previous assessment in 10/24    Time 6    Period Weeks    Status Achieved    Target Date 07/13/21       PT LONG TERM GOAL #3   Title Pt will be independent with HEP in order to improve strength and decrease back pain in order to improve seated tolerance at home    Baseline Pt current states severe LBP during seated posuture of 5-10 mins 10/24: Pt states that he is currently able to sit WNL without any LBP    Time 6    Period Weeks    Status Achieved    Target Date 05/30/21      PT LONG TERM GOAL #4   Title Pt will tolerate 5 mins of standing in // bars with Min A to promote WB and decreased pressure to post pelvic in seated/supine posture    Baseline deferred to later tx. 10/24: 53mns. Pt displayed marked RLE fatigue with visable quad shaking. 11/2: 4 Mins with only occ R UE touch assit and occ wood dowel RUE shoulder flexion to minmic casting a fishing rod.    Time 6    Period Weeks    Status Achieved    Target Date 08/15/21      PT LONG TERM GOAL #5   Title Pt will amb 100 ft with HW and CGA-MIN A with a gait belt donned for greater access to functional mobility in the home.    Baseline Pt is currently able to amb 20 Ft in the // bars with Mod A and extensive verbal and contact cueing to ensure safe mobility. 11/2: Pt was able to amb 40 ft with quad cane and CGA- Min A with extensive verbal cueing for proper LE sequencing.  1[facility]9:  [facility]  Mod. A for safety/ BOS    Time 8    Period Weeks    Status Partially Met    Target Date 10/10/21      Additional Long Term Goals   Additional Long Term Goals Yes      PT LONG TERM GOAL #6   Title Pt. able to ascend/ descend 4 stairs with use of R UE assist/ handrail and step pattern with min. A safely to improve mobility outside of house.    Baseline Mod./max. A to ascend/ descend  stairs with step to pattern.  Scissoring gait esp. with descending.    Time 8    Period Weeks    Status Partially Met    Target Date 10/10/21      PT LONG TERM GOAL #7   Title Pt. will be able to stand from chair with proper  technique/ use of HW and maintain upright standing posture with SBA/mod. I to improve safety with transfers.    Baseline Pt. requires heavy UE assist (pulling on //-bars) to stand upright.  Limited currently with HW without PT assist.    Time 8    Period Weeks    Status New    Target Date 10/10/21                Plan - 08/24/21 1447     Clinical Impression Statement Pt. states he is feeling better and is motivated to continue to work on ambulation. Pt. still needs consistent cueing to increase BOS and step length. Pt. is continuing to progress with walking endurance as well as STS capabilities. PT discussed HW use at home with a family member to [facility] for safety. Pt. will continue to benefit from balance and stability exercises to allow him to safely ambulate.    Personal Factors and Comorbidities Age;Comorbidity 3+    Examination-Activity Limitations Bathing;Reach Overhead;Stairs;Stand;Dressing;Bed Mobility;Hygiene/Grooming;Lift;Transfers;Carry;Locomotion Level    Examination-Participation Restrictions Occupation    Stability/Clinical Decision Making Evolving/Moderate complexity    Clinical Decision Making Moderate    Rehab Potential Fair    PT Frequency 2x / week    PT Duration 8 weeks    PT Treatment/Interventions ADLs/Self Care Home Management;Biofeedback;Electrical Stimulation;Cryotherapy;Moist Heat;Traction;Ultrasound;DME Instruction;Gait training;Stair training;Functional mobility training;Therapeutic activities;Neuromuscular re-education;Balance training;Therapeutic exercise;Patient/family education;Manual techniques;Wheelchair mobility training;Energy conservation;Dry needling;Passive range of motion;Vestibular;Joint Manipulations;Spinal Manipulations    PT Next Visit Plan Continue gait progression    PT Home Exercise Plan [email or web address]    Consulted and Agree with Plan of Care Patient;Family member/caregiver    Family Member Consulted Mother             Patient will benefit  from skilled therapeutic intervention in order to improve the following deficits and impairments:  Abnormal gait, Cardiopulmonary status limiting activity, Decreased balance, Decreased activity tolerance, Decreased cognition, Decreased coordination, Decreased endurance, Decreased knowledge of use of DME, Decreased mobility, Decreased safety awareness, Decreased range of motion, Difficulty walking, Decreased strength, Hypomobility, Hypermobility, Increased muscle spasms, Impaired flexibility, Impaired perceived functional ability, Impaired tone, Impaired UE functional use, Postural dysfunction, Improper body mechanics, Pain  Visit Diagnosis: Hemiplegia and hemiparesis following cerebral infarction affecting left non-dominant side (HCC)  Muscle weakness (generalized)  Spastic hemiplegia of left 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  Pura Spice, PT, DPT # [id number] Cleopatra Cedar, SPT 08/25/2021, 8:34 AM  [place] [facility] [address] [place], [place], [address] Phone: [phone]   Fax:  [phone]  Name: Barry Olson MRN: [id number] Date of Birth: [birth year]

## 2021-08-25 ENCOUNTER — Encounter: Payer: Self-pay | Admitting: Physical Therapy

## 2021-08-29 ENCOUNTER — Ambulatory Visit: Payer: Medicaid Other | Admitting: Physical Therapy

## 2021-08-31 ENCOUNTER — Ambulatory Visit: Payer: Medicaid Other | Admitting: Physical Therapy

## 2021-09-12 ENCOUNTER — Ambulatory Visit: Payer: Medicaid Other | Attending: Internal Medicine | Admitting: Physical Therapy

## 2021-09-12 ENCOUNTER — Encounter: Payer: Self-pay | Admitting: Physical Therapy

## 2021-09-12 ENCOUNTER — Other Ambulatory Visit: Payer: Self-pay

## 2021-09-12 DIAGNOSIS — Z7409 Other reduced mobility: Secondary | ICD-10-CM | POA: Diagnosis present

## 2021-09-12 DIAGNOSIS — M6281 Muscle weakness (generalized): Secondary | ICD-10-CM | POA: Insufficient documentation

## 2021-09-12 DIAGNOSIS — I69354 Hemiplegia and hemiparesis following cerebral infarction affecting left non-dominant side: Secondary | ICD-10-CM | POA: Insufficient documentation

## 2021-09-12 NOTE — Therapy (Deleted)
[facility] [address] [place], [place], [address] Phone: [phone]   Fax:  [phone]  Physical Therapy Treatment  Patient Details  Name: Barry Olson MRN: [id number] Date of Birth: [birth year] Referring Provider (PT): Harrel Lemon, MD   Encounter Date: 09/12/2021   PT End of Session - 09/12/21 1506     Visit Number 21    Number of Visits 33    Date for PT Re-Evaluation 10/10/21    Authorization Time Period CAID: 1 of 12 (10/21/21)    Authorization - Visit Number 1    Authorization - Number of Visits 10    Progress Note Due on Visit 10    PT Start Time 1429    PT Stop Time 1518    PT Time Calculation (min) 49 min    Equipment Utilized During Treatment Gait belt    Activity Tolerance Patient tolerated treatment well;Patient limited by pain    Behavior During Therapy WFL for tasks assessed/performed;Flat affect             Past Medical History:  Diagnosis Date   Back pain     Past Surgical History:  Procedure Laterality Date   HERNIA REPAIR      There were no vitals filed for this visit.   Subjective Assessment - 09/12/21 1507     Subjective Pt. reports B knee pain when entering the clinic. Pt. received HW in the mail and is excited to start to use it for household ambulation.    Pertinent History Strong family support, Pt's mother would like pt to walk again to assist with transfers. Pt would like to return use of LUE. Pt was standing with modified walking when d/c from hospital/rehab last year.    Limitations Walking;Lifting;Standing;House hold activities    How long can you sit comfortably? 5-10 mins    How long can you stand comfortably? Unable due to caregiver fear of falling.    How long can you walk comfortably? Unable    Diagnostic tests x-ray 10/06/20: WNL    Patient Stated Goals Increase use of LUE, Walking and preform transfers with independence.    Currently in Pain? Yes    Pain Score 8      Pain Location Knee    Pain Orientation Right;Left    Pain Descriptors / Indicators Aching    Pain Onset More than a month ago              There.ex.:   Nustep L2 (limited due to knee pain)10 min. R UE/B LE.   Tactile/verbal cuing keep L LE/knee in midline  Seated marching/ LAQ 2x10 with PT assist  Supine ball squeeze 2x8 limited by pain     Neuro:    Amb. With HW from car to gym.  Amb. With increase step length/ BOS while using HW. Min-mod. A for safety and cuing.       Walking in //-bars with R UE assist and CGA 4 laps. Moderate cuing to increase BOS/ R step length during L LE stance phase of gait.       Standing wt. Shifts in //-bars with heavy R UE assist. Pt. Is able to bear wt. On L LE after cueing to keep feet shoulder width apart.      Amb. With HW from gym to car. Pt. Has increase B knee pain, cued to increase step width and length for BOS.      PT Long Term Goals -  08/15/21 2037       PT LONG TERM GOAL #1   Title Pt will increase FOTO score to predicted value of 38 for improvement in self-reported function with ADL    Baseline 12 10/14: 34 11/22: 45 (achieved on the 10th visit)    Time 6    Period Weeks    Status Achieved    Target Date 06/08/21      PT LONG TERM GOAL #2   Title Pt will be Mod I with bed-to-w/c transfers when transfering to the R with sliding board and occasional cueing    Baseline CGA-minA with therapist. max A-TD with mother. 10/24: Pt was able to complete w/c-to-bed and bed-to-w/c with occ verbal cueing and CGA assist without sliding board 11/2: ISQ from previous assessment in 10/24    Time 6    Period Weeks    Status Achieved    Target Date 07/13/21      PT LONG TERM GOAL #3   Title Pt will be independent with HEP in order to improve strength and decrease back pain in order to improve seated tolerance at home    Baseline Pt current states severe LBP during seated posuture of 5-10 mins 10/24: Pt states that he is currently able to sit  WNL without any LBP    Time 6    Period Weeks    Status Achieved    Target Date 05/30/21      PT LONG TERM GOAL #4   Title Pt will tolerate 5 mins of standing in // bars with Min A to promote WB and decreased pressure to post pelvic in seated/supine posture    Baseline deferred to later tx. 10/24: 11mns. Pt displayed marked RLE fatigue with visable quad shaking. 11/2: 4 Mins with only occ R UE touch assit and occ wood dowel RUE shoulder flexion to minmic casting a fishing rod.    Time 6    Period Weeks    Status Achieved    Target Date 08/15/21      PT LONG TERM GOAL #5   Title Pt will amb 100 ft with HW and CGA-MIN A with a gait belt donned for greater access to functional mobility in the home.    Baseline Pt is currently able to amb 20 Ft in the // bars with Mod A and extensive verbal and contact cueing to ensure safe mobility. 11/2: Pt was able to amb 40 ft with quad cane and CGA- Min A with extensive verbal cueing for proper LE sequencing.  1[facility]9:  [facility]  Mod. A for safety/ BOS    Time 8    Period Weeks    Status Partially Met    Target Date 10/10/21      Additional Long Term Goals   Additional Long Term Goals Yes      PT LONG TERM GOAL #6   Title Pt. able to ascend/ descend 4 stairs with use of R UE assist/ handrail and step pattern with min. A safely to improve mobility outside of house.    Baseline Mod./max. A to ascend/ descend stairs with step to pattern.  Scissoring gait esp. with descending.    Time 8    Period Weeks    Status Partially Met    Target Date 10/10/21      PT LONG TERM GOAL #7   Title Pt. will be able to stand from chair with proper technique/ use of HW and  maintain upright standing posture with SBA/mod. I to improve safety with transfers.    Baseline Pt. requires heavy UE assist (pulling on //-bars) to stand upright.  Limited currently with HW without PT assist.    Time 8    Period Weeks    Status New    Target Date  10/10/21            Patient will benefit from skilled therapeutic intervention in order to improve the following deficits and impairments:  Abnormal gait, Cardiopulmonary status limiting activity, Decreased balance, Decreased activity tolerance, Decreased cognition, Decreased coordination, Decreased endurance, Decreased knowledge of use of DME, Decreased mobility, Decreased safety awareness, Decreased range of motion, Difficulty walking, Decreased strength, Hypomobility, Hypermobility, Increased muscle spasms, Impaired flexibility, Impaired perceived functional ability, Impaired tone, Impaired UE functional use, Postural dysfunction, Improper body mechanics, Pain  Visit Diagnosis: Hemiplegia and hemiparesis following cerebral infarction affecting left non-dominant side (HCC)  Muscle weakness (generalized)  Decreased independence with transfers     Problem List There are no problems to display for this patient.   Pura Spice, PT 09/12/2021, 6:11 PM  [facility] [address] [place], [place], [address] Phone: [phone]   Fax:  [phone]  Name: Barry Olson MRN: [id number] Date of Birth: [birth year]

## 2021-09-12 NOTE — Therapy (Signed)
[facility] [address] [place], [place], [address] Phone: [phone]   Fax:  [phone]  Physical Therapy Treatment  Patient Details  Name: Barry Olson MRN: [id number] Date of Birth: 04-17-1976 Referring Provider (PT): Harrel Lemon, MD   Encounter Date: 09/12/2021   PT End of Session - 09/12/21 1506     Visit Number 21    Number of Visits 33    Date for PT Re-Evaluation 10/10/21    Authorization Time Period CAID: 1 of 12 (10/21/21)    Authorization - Visit Number 1    Authorization - Number of Visits 10    Progress Note Due on Visit 10    PT Start Time 1429    PT Stop Time 1518    PT Time Calculation (min) 49 min    Equipment Utilized During Treatment Gait belt    Activity Tolerance Patient tolerated treatment well;Patient limited by pain    Behavior During Therapy WFL for tasks assessed/performed;Flat affect             Past Medical History:  Diagnosis Date   Back pain     Past Surgical History:  Procedure Laterality Date   HERNIA REPAIR      There were no vitals filed for this visit.   Subjective Assessment - 09/12/21 1507     Subjective Pt. reports B knee pain when entering the clinic. Pt. received HW in the mail and is excited to start to use it for household ambulation.    Pertinent History Strong family support, Pt's mother would like pt to walk again to assist with transfers. Pt would like to return use of LUE. Pt was standing with modified walking when d/c from hospital/rehab last year.    Limitations Walking;Lifting;Standing;House hold activities    How long can you sit comfortably? 5-10 mins    How long can you stand comfortably? Unable due to caregiver fear of falling.    How long can you walk comfortably? Unable    Diagnostic tests x-ray 10/06/20: WNL    Patient Stated Goals Increase use of LUE, Walking and preform transfers with independence.    Currently in Pain? Yes    Pain Score 8      Pain Location Knee    Pain Orientation Right;Left    Pain Descriptors / Indicators Aching    Pain Onset More than a month ago                There.ex.:   Nustep L2 (limited due to knee pain)10 min. R UE/B LE.   Tactile/verbal cuing keep L LE/knee in midline  Seated marching/ LAQ 2x10 with PT assist  Supine ball squeeze 2x8 limited by pain     Neuro:    Amb. With HW from car to gym.  Amb. With increase step length/ BOS while using HW. Min-mod. A for safety and cuing.       Walking in //-bars with R UE assist and CGA 4 laps. Moderate cuing to increase BOS/ R step length during L LE stance phase of gait.       Standing wt. Shifts in //-bars with heavy R UE assist. Pt. Is able to bear wt. On L LE after cueing to keep feet shoulder width apart.      Amb. With HW from gym to car. Pt. Has increase B knee pain, cued to increase step width and length for BOS.  PT Long Term Goals - 08/15/21 2037       PT LONG TERM GOAL #1   Title Pt will increase FOTO score to predicted value of 38 for improvement in self-reported function with ADL    Baseline 12 10/14: 34 11/22: 45 (achieved on the 10th visit)    Time 6    Period Weeks    Status Achieved    Target Date 06/08/21      PT LONG TERM GOAL #2   Title Pt will be Mod I with bed-to-w/c transfers when transfering to the R with sliding board and occasional cueing    Baseline CGA-minA with therapist. max A-TD with mother. 10/24: Pt was able to complete w/c-to-bed and bed-to-w/c with occ verbal cueing and CGA assist without sliding board 11/2: ISQ from previous assessment in 10/24    Time 6    Period Weeks    Status Achieved    Target Date 07/13/21      PT LONG TERM GOAL #3   Title Pt will be independent with HEP in order to improve strength and decrease back pain in order to improve seated tolerance at home    Baseline Pt current states severe LBP during seated posuture of 5-10 mins 10/24: Pt states that he is currently  able to sit WNL without any LBP    Time 6    Period Weeks    Status Achieved    Target Date 05/30/21      PT LONG TERM GOAL #4   Title Pt will tolerate 5 mins of standing in // bars with Min A to promote WB and decreased pressure to post pelvic in seated/supine posture    Baseline deferred to later tx. 10/24: 40mns. Pt displayed marked RLE fatigue with visable quad shaking. 11/2: 4 Mins with only occ R UE touch assit and occ wood dowel RUE shoulder flexion to minmic casting a fishing rod.    Time 6    Period Weeks    Status Achieved    Target Date 08/15/21      PT LONG TERM GOAL #5   Title Pt will amb 100 ft with HW and CGA-MIN A with a gait belt donned for greater access to functional mobility in the home.    Baseline Pt is currently able to amb 20 Ft in the // bars with Mod A and extensive verbal and contact cueing to ensure safe mobility. 11/2: Pt was able to amb 40 ft with quad cane and CGA- Min A with extensive verbal cueing for proper LE sequencing.  1/9:  [facility]  Mod. A for safety/ BOS    Time 8    Period Weeks    Status Partially Met    Target Date 10/10/21      Additional Long Term Goals   Additional Long Term Goals Yes      PT LONG TERM GOAL #6   Title Pt. able to ascend/ descend 4 stairs with use of R UE assist/ handrail and step pattern with min. A safely to improve mobility outside of house.    Baseline Mod./max. A to ascend/ descend stairs with step to pattern.  Scissoring gait esp. with descending.    Time 8    Period Weeks    Status Partially Met    Target Date 10/10/21      PT LONG TERM GOAL #7   Title Pt. will be able to stand from chair with proper  technique/ use of HW and maintain upright standing posture with SBA/mod. I to improve safety with transfers.    Baseline Pt. requires heavy UE assist (pulling on //-bars) to stand upright.  Limited currently with HW without PT assist.    Time 8    Period Weeks    Status New     Target Date 10/10/21                   Plan - 09/12/21 1544     Clinical Impression Statement Pt. presented with an increase in B knee pain during tx. Pt. is continuing to demonstrate improvement with his abilities to ambulate with a HW but requires constant cueing to widen steps, so BOS is increased. Pt. will continue to benefit from consistent PT to strengthen LE and build his walking tolerance. Pt. is looking to be able to ambulate more independently in his house and received a HW to start practicing. Pt. will benefit from building confidence in his ambulation abilities in a safe clinic environment.    Personal Factors and Comorbidities Age;Comorbidity 3+    Examination-Activity Limitations Bathing;Reach Overhead;Stairs;Stand;Dressing;Bed Mobility;Hygiene/Grooming;Lift;Transfers;Carry;Locomotion Level    Examination-Participation Restrictions Occupation    Stability/Clinical Decision Making Evolving/Moderate complexity    Rehab Potential Fair    PT Frequency 2x / week    PT Duration 8 weeks    PT Treatment/Interventions ADLs/Self Care Home Management;Biofeedback;Electrical Stimulation;Cryotherapy;Moist Heat;Traction;Ultrasound;DME Instruction;Gait training;Stair training;Functional mobility training;Therapeutic activities;Neuromuscular re-education;Balance training;Therapeutic exercise;Patient/family education;Manual techniques;Wheelchair mobility training;Energy conservation;Dry needling;Passive range of motion;Vestibular;Joint Manipulations;Spinal Manipulations    PT Next Visit Plan Continue gait progression    PT Home Exercise Plan [email or web address]    Consulted and Agree with Plan of Care Patient;Family member/caregiver    Family Member Consulted Mother             Patient will benefit from skilled therapeutic intervention in order to improve the following deficits and impairments:  Abnormal gait, Cardiopulmonary status limiting activity, Decreased balance, Decreased activity  tolerance, Decreased cognition, Decreased coordination, Decreased endurance, Decreased knowledge of use of DME, Decreased mobility, Decreased safety awareness, Decreased range of motion, Difficulty walking, Decreased strength, Hypomobility, Hypermobility, Increased muscle spasms, Impaired flexibility, Impaired perceived functional ability, Impaired tone, Impaired UE functional use, Postural dysfunction, Improper body mechanics, Pain  Visit Diagnosis: Hemiplegia and hemiparesis following cerebral infarction affecting left non-dominant side (HCC)  Muscle weakness (generalized)  Decreased independence with transfers     Problem List There are no problems to display for this patient.  Pura Spice, PT, DPT # [phone] 09/12/2021, 6:13 PM  [facility] [address] [place], [place], [address] Phone: [phone]   Fax:  [phone]  Name: Barry Olson MRN: [id number] Date of Birth: Jun 30, 1976

## 2021-09-14 ENCOUNTER — Encounter: Payer: Medicaid Other | Admitting: Physical Therapy

## 2021-09-19 ENCOUNTER — Encounter: Payer: Self-pay | Admitting: Physical Therapy

## 2021-09-19 ENCOUNTER — Ambulatory Visit: Payer: Medicaid Other | Admitting: Physical Therapy

## 2021-09-19 ENCOUNTER — Other Ambulatory Visit: Payer: Self-pay

## 2021-09-19 DIAGNOSIS — M6281 Muscle weakness (generalized): Secondary | ICD-10-CM

## 2021-09-19 DIAGNOSIS — I69354 Hemiplegia and hemiparesis following cerebral infarction affecting left non-dominant side: Secondary | ICD-10-CM

## 2021-09-19 DIAGNOSIS — Z7409 Other reduced mobility: Secondary | ICD-10-CM

## 2021-09-19 NOTE — Therapy (Signed)
[facility] [address] [place], [place], [address] Phone: [phone]   Fax:  [phone]  Physical Therapy Treatment  Patient Details  Name: Barry Olson MRN: [id number] Date of Birth: Jul 05, 1976 Referring Provider (PT): Harrel Lemon, MD   Encounter Date: 09/19/2021   PT End of Session - 09/19/21 1319     Visit Number 22    Number of Visits 33    Date for PT Re-Evaluation 10/10/21    Authorization Time Period CAID: 2 of 12 (10/21/21)    Authorization - Visit Number 2    Authorization - Number of Visits 10    Progress Note Due on Visit 10    PT Start Time 1302    PT Stop Time 1401    PT Time Calculation (min) 59 min    Equipment Utilized During Treatment Gait belt    Activity Tolerance Patient tolerated treatment well;Patient limited by pain    Behavior During Therapy WFL for tasks assessed/performed;Flat affect             Past Medical History:  Diagnosis Date   Back pain     Past Surgical History:  Procedure Laterality Date   HERNIA REPAIR      There were no vitals filed for this visit.   Subjective Assessment - 09/19/21 1316     Subjective Pt. reports 8/10 R knee pain while walking into PT clinic.  Pt. reports watching a lot of TV this past weekend.  Pt. walking short distances at home with HW.  Pt. states R knee pain resolves during seated rest break.    Pertinent History Strong family support, Pt's mother would like pt to walk again to assist with transfers. Pt would like to return use of LUE. Pt was standing with modified walking when d/c from hospital/rehab last year.    Limitations Walking;Lifting;Standing;House hold activities    How long can you sit comfortably? 5-10 mins    How long can you stand comfortably? Unable due to caregiver fear of falling.    How long can you walk comfortably? Unable    Diagnostic tests x-ray 10/06/20: WNL    Patient Stated Goals Increase use of LUE, Walking and  preform transfers with independence.    Currently in Pain? Yes    Pain Score 8     Pain Location Knee    Pain Orientation Right    Pain Type Chronic pain    Pain Onset More than a month ago    Multiple Pain Sites Yes    Pain Score 5    Pain Location Knee    Pain Orientation Left    Pain Descriptors / Indicators Aching    Pain Type Chronic pain               Neuro:    Amb. With HW from car to gym.  Amb. With increase step length/ BOS while using HW. Min-mod. A for safety and cuing.       Walking in //-bars with R UE assist and CGA 3 laps.  Moderate cuing to increase BOS/ R step length during L LE stance phase of gait.   Alt. cone touches in //-bars (2x with min. A required).     Standing wt. Shifts in //-bars with heavy R UE assist. Pt. Is able to bear wt. On L LE after cueing to keep feet shoulder width apart.      Amb. With HW from gym to car. Pt.  Has increase B knee pain, cued to increase step width and length for BOS.      There.ex.:   Standing hip extension/ knee flexion in //-bars 10x each.  Heavy R UE assist/ cuing to correct BOS.    Nustep L2 (limited due to knee pain)10 min. R UE/B LE.   Tactile/verbal cuing keep L LE/knee in midline.   Seated marching/ LAQ 2x10 with PT assist  Supine ball squeeze 2x8 limited by pain      PT Long Term Goals - 08/15/21 2037       PT LONG TERM GOAL #1   Title Pt will increase FOTO score to predicted value of 38 for improvement in self-reported function with ADL    Baseline 12 10/14: 34 11/22: 45 (achieved on the 10th visit)    Time 6    Period Weeks    Status Achieved    Target Date 06/08/21      PT LONG TERM GOAL #2   Title Pt will be Mod I with bed-to-w/c transfers when transfering to the R with sliding board and occasional cueing    Baseline CGA-minA with therapist. max A-TD with mother. 10/24: Pt was able to complete w/c-to-bed and bed-to-w/c with occ verbal cueing and CGA assist without sliding board 11/2: ISQ from  previous assessment in 10/24    Time 6    Period Weeks    Status Achieved    Target Date 07/13/21      PT LONG TERM GOAL #3   Title Pt will be independent with HEP in order to improve strength and decrease back pain in order to improve seated tolerance at home    Baseline Pt current states severe LBP during seated posuture of 5-10 mins 10/24: Pt states that he is currently able to sit WNL without any LBP    Time 6    Period Weeks    Status Achieved    Target Date 05/30/21      PT LONG TERM GOAL #4   Title Pt will tolerate 5 mins of standing in // bars with Min A to promote WB and decreased pressure to post pelvic in seated/supine posture    Baseline deferred to later tx. 10/24: 65mins. Pt displayed marked RLE fatigue with visable quad shaking. 11/2: 4 Mins with only occ R UE touch assit and occ wood dowel RUE shoulder flexion to minmic casting a fishing rod.    Time 6    Period Weeks    Status Achieved    Target Date 08/15/21      PT LONG TERM GOAL #5   Title Pt will amb 100 ft with HW and CGA-MIN A with a gait belt donned for greater access to functional mobility in the home.    Baseline Pt is currently able to amb 20 Ft in the // bars with Mod A and extensive verbal and contact cueing to ensure safe mobility. 11/2: Pt was able to amb 40 ft with quad cane and CGA- Min A with extensive verbal cueing for proper LE sequencing.  1/9:  pt. benefits from [facility] with gait in clinic.  Mod. A for safety/ BOS    Time 8    Period Weeks    Status Partially Met    Target Date 10/10/21      Additional Long Term Goals   Additional Long Term Goals Yes      PT LONG TERM GOAL #6   Title Pt. able to ascend/ descend 4 stairs  with use of R UE assist/ handrail and step pattern with min. A safely to improve mobility outside of house.    Baseline Mod./max. A to ascend/ descend stairs with step to pattern.  Scissoring gait esp. with descending.    Time 8    Period Weeks    Status Partially Met    Target  Date 10/10/21      PT LONG TERM GOAL #7   Title Pt. will be able to stand from chair with proper technique/ use of HW and maintain upright standing posture with SBA/mod. I to improve safety with transfers.    Baseline Pt. requires heavy UE assist (pulling on //-bars) to stand upright.  Limited currently with HW without PT assist.    Time 8    Period Weeks    Status New    Target Date 10/10/21                Plan - 09/19/21 1539     Clinical Impression Statement Pt. reports increase LE pain at the beginning of the session while walking into PT clinic with use of HW and min.-mod. A for PT.  Pt. requires mod. A for safety/ proper technique/ foot position during sit to stand transfers from gray chair.  Pt. requires consistent cueing to increase BOS with L LE during standing and gait.  Pt. is motviated and planning to ambulate more at home with [facility].  Pt. will continue to benefit from practice with a HW as well as cueing for his BOS. Pt. is showing improvement and was able to ambulate from clinic to his vehicle and had a good support system who was excited to see some improvement.    Personal Factors and Comorbidities Age;Comorbidity 3+    Examination-Activity Limitations Bathing;Reach Overhead;Stairs;Stand;Dressing;Bed Mobility;Hygiene/Grooming;Lift;Transfers;Carry;Locomotion Level    Examination-Participation Restrictions Occupation    Stability/Clinical Decision Making Evolving/Moderate complexity    Clinical Decision Making Moderate    Rehab Potential Fair    PT Frequency 2x / week    PT Duration 8 weeks    PT Treatment/Interventions ADLs/Self Care Home Management;Biofeedback;Electrical Stimulation;Cryotherapy;Moist Heat;Traction;Ultrasound;DME Instruction;Gait training;Stair training;Functional mobility training;Therapeutic activities;Neuromuscular re-education;Balance training;Therapeutic exercise;Patient/family education;Manual techniques;Wheelchair mobility training;Energy  conservation;Dry needling;Passive range of motion;Vestibular;Joint Manipulations;Spinal Manipulations    PT Next Visit Plan Continue gait progression    PT Home Exercise Plan [email or web address]    Consulted and Agree with Plan of Care Patient;Family member/caregiver    Family Member Consulted Mother             Patient will benefit from skilled therapeutic intervention in order to improve the following deficits and impairments:  Abnormal gait, Cardiopulmonary status limiting activity, Decreased balance, Decreased activity tolerance, Decreased cognition, Decreased coordination, Decreased endurance, Decreased knowledge of use of DME, Decreased mobility, Decreased safety awareness, Decreased range of motion, Difficulty walking, Decreased strength, Hypomobility, Hypermobility, Increased muscle spasms, Impaired flexibility, Impaired perceived functional ability, Impaired tone, Impaired UE functional use, Postural dysfunction, Improper body mechanics, Pain  Visit Diagnosis: Hemiplegia and hemiparesis following cerebral infarction affecting left non-dominant side (HCC)  Muscle weakness (generalized)  Decreased independence with transfers  Spastic hemiplegia of left nondominant side as late effect of cerebral infarction [facility])     Problem List There are no problems to display for this patient.  Pura Spice, PT, DPT # [phone] 09/19/2021, 3:46 PM  [facility] [address] [place], [place], [address] Phone: [phone]   Fax:  [phone]  Name: Barry Olson MRN: [id number]  Date of Birth: Jan 15, 1976

## 2021-09-21 ENCOUNTER — Ambulatory Visit: Payer: Medicaid Other | Admitting: Physical Therapy

## 2021-09-21 ENCOUNTER — Other Ambulatory Visit: Payer: Self-pay

## 2021-09-21 DIAGNOSIS — M6281 Muscle weakness (generalized): Secondary | ICD-10-CM

## 2021-09-21 DIAGNOSIS — I69354 Hemiplegia and hemiparesis following cerebral infarction affecting left non-dominant side: Secondary | ICD-10-CM | POA: Diagnosis not present

## 2021-09-21 DIAGNOSIS — Z7409 Other reduced mobility: Secondary | ICD-10-CM

## 2021-09-26 ENCOUNTER — Ambulatory Visit: Payer: Medicaid Other | Admitting: Physical Therapy

## 2021-09-26 ENCOUNTER — Encounter: Payer: Self-pay | Admitting: Physical Therapy

## 2021-09-26 NOTE — Therapy (Signed)
[place] [facility] [address] [place], [place], [address] Phone: [phone]   Fax:  [phone]  Physical Therapy Treatment  Patient Details  Name: Barry Olson MRN: [id number] Date of Birth: [birth year] Referring Provider (PT): Harrel Lemon, MD   Encounter Date: 09/21/2021   PT End of Session - 09/26/21 1237     Visit Number 23    Number of Visits 33    Date for PT Re-Evaluation 10/10/21    Authorization Time Period CAID: 3 of 12 (10/21/21)    Authorization - Visit Number 3    Authorization - Number of Visits 10    Progress Note Due on Visit 10    PT Start Time 1243    PT Stop Time 1346    PT Time Calculation (min) 63 min    Equipment Utilized During Treatment Gait belt    Activity Tolerance Patient tolerated treatment well;Patient limited by pain    Behavior During Therapy WFL for tasks assessed/performed;Flat affect             Past Medical History:  Diagnosis Date   Back pain     Past Surgical History:  Procedure Laterality Date   HERNIA REPAIR      There were no vitals filed for this visit.   Subjective Assessment - 09/26/21 1234     Subjective Pt. reports no new complaints.  Pt. states he watches TV during the day and is using HW to walk short distances around the house.  PT assisted PT with standing from passenger seat of car and walking into PT gym with use of [employer].  No LOB but extra time required.  B knee pain remains with standing/walking tasks and pain resolves during seated rest breaks.    Pertinent History Strong family support, Pt's mother would like pt to walk again to assist with transfers. Pt would like to return use of LUE. Pt was standing with modified walking when d/c from hospital/rehab last year.    Limitations Walking;Lifting;Standing;House hold activities    How long can you sit comfortably? 5-10 mins    How long can you stand comfortably? Unable due to caregiver fear of falling.    How long  can you walk comfortably? Unable    Diagnostic tests x-ray 10/06/20: WNL    Patient Stated Goals Increase use of LUE, Walking and preform transfers with independence.    Currently in Pain? Yes    Pain Score 6     Pain Location Knee    Pain Orientation Right    Pain Descriptors / Indicators Aching    Pain Type Chronic pain    Pain Onset More than a month ago               Neuro:    Amb. With HW from car to gym.  Amb. With increase step length/ BOS while using HW and min. A for safety and cuing.       Walking in //-bars with R UE assist and CGA 3 laps.  Moderate cuing to increase BOS/ R step length during L LE stance phase of gait.  Forward/ backwards in //-bars (cuing to increase hip extension with backwards walking).    Standing wt. Shifts in //-bars with heavy R UE assist. Pt. Is able to bear wt. On L LE after cueing to keep feet shoulder width apart.    3" step ups/overs in //-bars with CGA-min. A for safety 3 laps.  Cuing to correct  posture.     Amb. With HW from gym to car.  Amb. From back door of gym to car in parking.  Walking down ramp and managing curb with HW.       There.ex.:   Standing hip extension/ knee flexion in //-bars 10x each.  Heavy R UE assist/ cuing to correct BOS.     Nustep L2 10 min. R UE/B LE.   Tactile/verbal cuing keep L LE/knee in midline.    Seated marching/ LAQ 2x10 with PT assist  Supine ball squeeze 2x8 limited by pain            PT Long Term Goals - 08/15/21 2037       PT LONG TERM GOAL #1   Title Pt will increase FOTO score to predicted value of 38 for improvement in self-reported function with ADL    Baseline 12 10/14: 34 11/22: 45 (achieved on the 10th visit)    Time 6    Period Weeks    Status Achieved    Target Date 06/08/21      PT LONG TERM GOAL #2   Title Pt will be Mod I with bed-to-w/c transfers when transfering to the R with sliding board and occasional cueing    Baseline CGA-minA with therapist. max A-TD with  mother. 10/24: Pt was able to complete w/c-to-bed and bed-to-w/c with occ verbal cueing and CGA assist without sliding board 11/2: ISQ from previous assessment in 10/24    Time 6    Period Weeks    Status Achieved    Target Date 07/13/21      PT LONG TERM GOAL #3   Title Pt will be independent with HEP in order to improve strength and decrease back pain in order to improve seated tolerance at home    Baseline Pt current states severe LBP during seated posuture of 5-10 mins 10/24: Pt states that he is currently able to sit WNL without any LBP    Time 6    Period Weeks    Status Achieved    Target Date 05/30/21      PT LONG TERM GOAL #4   Title Pt will tolerate 5 mins of standing in // bars with Min A to promote WB and decreased pressure to post pelvic in seated/supine posture    Baseline deferred to later tx. 10/24: 48mins. Pt displayed marked RLE fatigue with visable quad shaking. 11/2: 4 Mins with only occ R UE touch assit and occ wood dowel RUE shoulder flexion to minmic casting a fishing rod.    Time 6    Period Weeks    Status Achieved    Target Date 08/15/21      PT LONG TERM GOAL #5   Title Pt will amb 100 ft with HW and CGA-MIN A with a gait belt donned for greater access to functional mobility in the home.    Baseline Pt is currently able to amb 20 Ft in the // bars with Mod A and extensive verbal and contact cueing to ensure safe mobility. 11/2: Pt was able to amb 40 ft with quad cane and CGA- Min A with extensive verbal cueing for proper LE sequencing.  1/9:  pt. benefits from [facility] with gait in clinic.  Mod. A for safety/ BOS    Time 8    Period Weeks    Status Partially Met    Target Date 10/10/21      Additional Long Term Goals   Additional Long  Term Goals Yes      PT LONG TERM GOAL #6   Title Pt. able to ascend/ descend 4 stairs with use of R UE assist/ handrail and step pattern with min. A safely to improve mobility outside of house.    Baseline Mod./max. A to ascend/  descend stairs with step to pattern.  Scissoring gait esp. with descending.    Time 8    Period Weeks    Status Partially Met    Target Date 10/10/21      PT LONG TERM GOAL #7   Title Pt. will be able to stand from chair with proper technique/ use of HW and maintain upright standing posture with SBA/mod. I to improve safety with transfers.    Baseline Pt. requires heavy UE assist (pulling on //-bars) to stand upright.  Limited currently with HW without PT assist.    Time 8    Period Weeks    Status New    Target Date 10/10/21                   Plan - 09/26/21 1238     Clinical Impression Statement Pt. is motivated to walk more with HW and improve step pattern/ safety with walking/ decrease fall risk. Pt. still requires consistent cueing to increase BOS and step length.  Pt. is able to correct BOS in //-bars but limited while using HW in gym and benefits from CGA/min. A for safety.  Pt. is continuing to progress with walking endurance as well as STS capabilities.  No LOB during tx. session but moderate cuing for proper technique/ BOS. Pt. will continue to benefit from balance and stability exercises to allow him to safely ambulate.    Personal Factors and Comorbidities Age;Comorbidity 3+    Examination-Activity Limitations Bathing;Reach Overhead;Stairs;Stand;Dressing;Bed Mobility;Hygiene/Grooming;Lift;Transfers;Carry;Locomotion Level    Examination-Participation Restrictions Occupation    Stability/Clinical Decision Making Evolving/Moderate complexity    Clinical Decision Making Moderate    Rehab Potential Fair    PT Frequency 2x / week    PT Duration 8 weeks    PT Treatment/Interventions ADLs/Self Care Home Management;Biofeedback;Electrical Stimulation;Cryotherapy;Moist Heat;Traction;Ultrasound;DME Instruction;Gait training;Stair training;Functional mobility training;Therapeutic activities;Neuromuscular re-education;Balance training;Therapeutic exercise;Patient/family  education;Manual techniques;Wheelchair mobility training;Energy conservation;Dry needling;Passive range of motion;Vestibular;Joint Manipulations;Spinal Manipulations    PT Next Visit Plan Continue gait progression    PT Home Exercise Plan [email or web address]    Consulted and Agree with Plan of Care Patient;Family member/caregiver    Family Member Consulted Mother             Patient will benefit from skilled therapeutic intervention in order to improve the following deficits and impairments:  Abnormal gait, Cardiopulmonary status limiting activity, Decreased balance, Decreased activity tolerance, Decreased cognition, Decreased coordination, Decreased endurance, Decreased knowledge of use of DME, Decreased mobility, Decreased safety awareness, Decreased range of motion, Difficulty walking, Decreased strength, Hypomobility, Hypermobility, Increased muscle spasms, Impaired flexibility, Impaired perceived functional ability, Impaired tone, Impaired UE functional use, Postural dysfunction, Improper body mechanics, Pain  Visit Diagnosis: Hemiplegia and hemiparesis following cerebral infarction affecting left non-dominant side (HCC)  Muscle weakness (generalized)  Decreased independence with transfers  Spastic hemiplegia of left nondominant 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  Pura Spice, PT, DPT # [phone] 09/26/2021, 12:47 PM  [place] [facility] [address] [place], [place], [address] Phone: [phone]   Fax:  [phone]  Name: Barry Gala  HERSHALL Olson MRN: [id number] Date of Birth: Aug 05, 1976

## 2021-09-28 ENCOUNTER — Ambulatory Visit: Payer: Medicaid Other | Admitting: Physical Therapy

## 2021-10-03 ENCOUNTER — Ambulatory Visit: Payer: Medicaid Other | Admitting: Physical Therapy

## 2021-10-05 ENCOUNTER — Encounter: Payer: Self-pay | Admitting: Physical Therapy

## 2021-10-05 ENCOUNTER — Other Ambulatory Visit: Payer: Self-pay

## 2021-10-05 ENCOUNTER — Ambulatory Visit: Payer: Medicaid Other | Attending: Internal Medicine | Admitting: Physical Therapy

## 2021-10-05 DIAGNOSIS — M6281 Muscle weakness (generalized): Secondary | ICD-10-CM | POA: Insufficient documentation

## 2021-10-05 DIAGNOSIS — I69354 Hemiplegia and hemiparesis following cerebral infarction affecting left non-dominant side: Secondary | ICD-10-CM | POA: Insufficient documentation

## 2021-10-05 DIAGNOSIS — Z7409 Other reduced mobility: Secondary | ICD-10-CM | POA: Diagnosis present

## 2021-10-05 NOTE — Therapy (Addendum)
[facility] [address]. [place], [place], [address] Phone: [phone]   Fax:  [phone]  Physical Therapy Treatment  Patient Details  Name: Barry Olson MRN: [id number] Date of Birth: 10-24-1975 Referring Provider (PT): Harrel Lemon, MD   Encounter Date: 10/05/2021   PT End of Session - 10/05/21 1421     Visit Number 24    Number of Visits 33    Date for PT Re-Evaluation 10/10/21    Authorization Time Period CAID: 4 of 12 (10/21/21)    Authorization - Visit Number 4    Authorization - Number of Visits 10    Progress Note Due on Visit 10    PT Start Time 1300    PT Stop Time 1353    PT Time Calculation (min) 53 min    Equipment Utilized During Treatment Gait belt    Activity Tolerance Patient tolerated treatment well;Patient limited by pain    Behavior During Therapy WFL for tasks assessed/performed;Flat affect             Past Medical History:  Diagnosis Date   Back pain     Past Surgical History:  Procedure Laterality Date   HERNIA REPAIR      There were no vitals filed for this visit.   Subjective Assessment - 10/05/21 1419     Subjective Pt. arrives to clinic with severe L knee pain. Pt. is unable to ambulate with use of HW into the clinic and requires [facility] assistance.    Pertinent History Strong family support, Pt's mother would like pt to walk again to assist with transfers. Pt would like to return use of LUE. Pt was standing with modified walking when d/c from hospital/rehab last year.    Limitations Walking;Lifting;Standing;House hold activities    How long can you sit comfortably? 5-10 mins    How long can you stand comfortably? Unable due to caregiver fear of falling.    How long can you walk comfortably? Unable    Diagnostic tests x-ray 10/06/20: WNL    Patient Stated Goals Increase use of LUE, Walking and preform transfers with independence.    Currently in Pain? Yes    Pain Score 10-Worst pain  ever    Pain Location Knee    Pain Orientation Left    Pain Descriptors / Indicators Aching    Pain Type Chronic pain    Pain Onset More than a month ago              Manual: Supine AP tibiofemoral mobs. 2x20secs  Supine Tibial rotation. Pt. Notes increase in L knee pain. Supine hamstring stretch 3x30 seconds  Supine LAD 3x30 secs. Pt. Notes decrease in pain  Therex.  Supine SAQ with red bolster. 20x, Pt. Requires L LE assist with quad contraction. Supine ball squeezes/ clam shells. 20x Standing Wt. Shifts with CGA. 20x Walking in //-bars 4x. Pt. Required mod A R UE assist. Nustep L3 for 10 minutes.    PT Long Term Goals - 08/15/21 2037       PT LONG TERM GOAL #1   Title Pt will increase FOTO score to predicted value of 38 for improvement in self-reported function with ADL    Baseline 12 10/14: 34 11/22: 45 (achieved on the 10th visit)    Time 6    Period Weeks    Status Achieved    Target Date 06/08/21      PT LONG TERM GOAL #2  Title Pt will be Mod I with bed-to-w/c transfers when transfering to the R with sliding board and occasional cueing    Baseline CGA-minA with therapist. max A-TD with mother. 10/24: Pt was able to complete w/c-to-bed and bed-to-w/c with occ verbal cueing and CGA assist without sliding board 11/2: ISQ from previous assessment in 10/24    Time 6    Period Weeks    Status Achieved    Target Date 07/13/21      PT LONG TERM GOAL #3   Title Pt will be independent with HEP in order to improve strength and decrease back pain in order to improve seated tolerance at home    Baseline Pt current states severe LBP during seated posuture of 5-10 mins 10/24: Pt states that he is currently able to sit WNL without any LBP    Time 6    Period Weeks    Status Achieved    Target Date 05/30/21      PT LONG TERM GOAL #4   Title Pt will tolerate 5 mins of standing in // bars with Min A to promote WB and decreased pressure to post pelvic in seated/supine  posture    Baseline deferred to later tx. 10/24: 55mns. Pt displayed marked RLE fatigue with visable quad shaking. 11/2: 4 Mins with only occ R UE touch assit and occ wood dowel RUE shoulder flexion to minmic casting a fishing rod.    Time 6    Period Weeks    Status Achieved    Target Date 08/15/21      PT LONG TERM GOAL #5   Title Pt will amb 100 ft with HW and CGA-MIN A with a gait belt donned for greater access to functional mobility in the home.    Baseline Pt is currently able to amb 20 Ft in the // bars with Mod A and extensive verbal and contact cueing to ensure safe mobility. 11/2: Pt was able to amb 40 ft with quad cane and CGA- Min A with extensive verbal cueing for proper LE sequencing.  1[facility]9:  [facility]  Mod. A for safety/ BOS    Time 8    Period Weeks    Status Partially Met    Target Date 10/10/21      Additional Long Term Goals   Additional Long Term Goals Yes      PT LONG TERM GOAL #6   Title Pt. able to ascend/ descend 4 stairs with use of R UE assist/ handrail and step pattern with min. A safely to improve mobility outside of house.    Baseline Mod./max. A to ascend/ descend stairs with step to pattern.  Scissoring gait esp. with descending.    Time 8    Period Weeks    Status Partially Met    Target Date 10/10/21      PT LONG TERM GOAL #7   Title Pt. will be able to stand from chair with proper technique/ use of HW and maintain upright standing posture with SBA/mod. I to improve safety with transfers.    Baseline Pt. requires heavy UE assist (pulling on //-bars) to stand upright.  Limited currently with HW without PT assist.    Time 8    Period Weeks    Status New    Target Date 10/10/21                   Plan - 10/05/21 1421  Clinical Impression Statement Pt. arrives to PT with 10/10 NPS L knee pain limiting his ability to WB when ambulating with HW. Pt. requires WC to get around the clinic. PT. performs L knee  joint mobs. to assess potential intraarticular pathology; Pt. responds well to AP knee mobs but has a notable increase in pain when Tibial rotation is assessed. Pt. has no L knee pain when lying supine and states he has not previously had L knee pain that severe. Pt. was able to perform [place] hip and knee strengthening exercises and progress to walking in //-bars with less L knee pain. Pt. will benefit from regular strengthening and gait training in the clinic to increase his ability to perform household ambulation.    Personal Factors and Comorbidities Age;Comorbidity 3+    Examination-Activity Limitations Bathing;Reach Overhead;Stairs;Stand;Dressing;Bed Mobility;Hygiene/Grooming;Lift;Transfers;Carry;Locomotion Level    Examination-Participation Restrictions Occupation    Stability/Clinical Decision Making Evolving/Moderate complexity    Clinical Decision Making Moderate    Rehab Potential Fair    PT Frequency 2x / week    PT Duration 8 weeks    PT Treatment/Interventions ADLs/Self Care Home Management;Biofeedback;Electrical Stimulation;Cryotherapy;Moist Heat;Traction;Ultrasound;DME Instruction;Gait training;Stair training;Functional mobility training;Therapeutic activities;Neuromuscular re-education;Balance training;Therapeutic exercise;Patient/family education;Manual techniques;Wheelchair mobility training;Energy conservation;Dry needling;Passive range of motion;Vestibular;Joint Manipulations;Spinal Manipulations    PT Next Visit Plan Continue gait progression    PT Home Exercise Plan [email or web address]    Consulted and Agree with Plan of Care Patient;Family member/caregiver    Family Member Consulted Mother             Patient will benefit from skilled therapeutic intervention in order to improve the following deficits and impairments:  Abnormal gait, Cardiopulmonary status limiting activity, Decreased balance, Decreased activity tolerance, Decreased cognition, Decreased coordination, Decreased  endurance, Decreased knowledge of use of DME, Decreased mobility, Decreased safety awareness, Decreased range of motion, Difficulty walking, Decreased strength, Hypomobility, Hypermobility, Increased muscle spasms, Impaired flexibility, Impaired perceived functional ability, Impaired tone, Impaired UE functional use, Postural dysfunction, Improper body mechanics, Pain  Visit Diagnosis: Hemiplegia and hemiparesis following cerebral infarction affecting left non-dominant side (HCC)  Muscle weakness (generalized)  Spastic hemiplegia of left nondominant side as late effect of cerebral infarction [facility])     Problem List There are no problems to display for this patient.  Pura Spice, PT, DPT # [id number] Cleopatra Cedar, SPT 10/06/2021, 8:19 AM  [facility] [facility] [address] [place], [place], [address] Phone: [phone]   Fax:  [phone]  Name: COLLAN SCHOENFELD MRN: [id number] Date of Birth: [birth year]

## 2021-10-10 ENCOUNTER — Other Ambulatory Visit: Payer: Self-pay

## 2021-10-10 ENCOUNTER — Ambulatory Visit: Payer: Medicaid Other | Admitting: Physical Therapy

## 2021-10-10 DIAGNOSIS — I69354 Hemiplegia and hemiparesis following cerebral infarction affecting left non-dominant side: Secondary | ICD-10-CM | POA: Diagnosis not present

## 2021-10-10 DIAGNOSIS — Z7409 Other reduced mobility: Secondary | ICD-10-CM

## 2021-10-10 DIAGNOSIS — M6281 Muscle weakness (generalized): Secondary | ICD-10-CM

## 2021-10-10 NOTE — Therapy (Signed)
[facility] [address]. [place], [place], [address] Phone: [phone]   Fax:  [phone]  Physical Therapy Treatment  Patient Details  Name: Barry Olson MRN: [id number] Date of Birth: May 19, 1976 Referring Provider (PT): Harrel Lemon, MD   Encounter Date: 10/10/2021   PT End of Session - 10/10/21 1903     Visit Number 25    Number of Visits 33    Date for PT Re-Evaluation 10/10/21    Authorization Time Period CAID: 4 of 12 (10/21/21)    Authorization - Visit Number 5    Authorization - Number of Visits 10    Progress Note Due on Visit 10    PT Start Time 1253    PT Stop Time 1352    PT Time Calculation (min) 59 min    Equipment Utilized During Treatment Gait belt    Activity Tolerance Patient tolerated treatment well;Patient limited by pain    Behavior During Therapy WFL for tasks assessed/performed;Flat affect             Past Medical History:  Diagnosis Date   Back pain     Past Surgical History:  Procedure Laterality Date   HERNIA REPAIR      There were no vitals filed for this visit.   Subjective Assessment - 10/10/21 1900     Subjective PT met pt. at passenger side of truck to assist with [place] crutch.  Pt. reports moderate B knee pain with standing but able to ambulate 20 feet before requiring w/c to get to gym.    Pertinent History Strong family support, Pt's mother would like pt to walk again to assist with transfers. Pt would like to return use of LUE. Pt was standing with modified walking when d/c from hospital/rehab last year.    Limitations Walking;Lifting;Standing;House hold activities    How long can you sit comfortably? 5-10 mins    How long can you stand comfortably? Unable due to caregiver fear of falling.    How long can you walk comfortably? Unable    Diagnostic tests x-ray 10/06/20: WNL    Patient Stated Goals Increase use of LUE, Walking and preform transfers with independence.     Currently in Pain? Yes   no subjective pain score given   Pain Location Knee    Pain Orientation Right;Left    Pain Onset More than a month ago              Neuro:    Amb. With HW from car 20 feet prior to sitting in w/c (L knee pain).  Amb. With increase step length/ BOS while using HW and min. A for safety and cuing.       Walking in //-bars with R UE assist and CGA 4 laps.  Moderate cuing to increase BOS/ R step length during L LE stance phase of gait.  Forward/ backwards in //-bars (cuing to increase hip extension with backwards walking).    Standing wt. Shifts in //-bars with heavy R UE assist. Pt. Is able to bear wt. On L LE after cueing to keep feet shoulder width apart.     Lateral walking in //-bars 3 laps (cuing to maintain midline LE position).      Amb. With HW from gym to car (>100 feet).   Amb. From back door of gym to car in parking.  Walking down ramp and managing curb with HW.       There.ex.:  Standing hip extension/ knee flexion in //-bars 10x each.  Heavy R UE assist/ cuing to correct BOS.     Nustep L0-2 5 min. R UE/B LE.   Tactile/verbal cuing keep L LE/knee in midline.  Limited today by increase L LE clonus and unable to correct with repositioning foot.   Supine L LE SLR/ hamstring and hip stretches/ L shoulder PROM 5x each as tolerated.          PT Long Term Goals - 08/15/21 2037       PT LONG TERM GOAL #1   Title Pt will increase FOTO score to predicted value of 38 for improvement in self-reported function with ADL    Baseline 12 10/14: 34 11/22: 45 (achieved on the 10th visit)    Time 6    Period Weeks    Status Achieved    Target Date 06/08/21      PT LONG TERM GOAL #2   Title Pt will be Mod I with bed-to-w/c transfers when transfering to the R with sliding board and occasional cueing    Baseline CGA-minA with therapist. max A-TD with mother. 10/24: Pt was able to complete w/c-to-bed and bed-to-w/c with occ verbal cueing and CGA assist  without sliding board 11/2: ISQ from previous assessment in 10/24    Time 6    Period Weeks    Status Achieved    Target Date 07/13/21      PT LONG TERM GOAL #3   Title Pt will be independent with HEP in order to improve strength and decrease back pain in order to improve seated tolerance at home    Baseline Pt current states severe LBP during seated posuture of 5-10 mins 10/24: Pt states that he is currently able to sit WNL without any LBP    Time 6    Period Weeks    Status Achieved    Target Date 05/30/21      PT LONG TERM GOAL #4   Title Pt will tolerate 5 mins of standing in // bars with Min A to promote WB and decreased pressure to post pelvic in seated/supine posture    Baseline deferred to later tx. 10/24: 47mns. Pt displayed marked RLE fatigue with visable quad shaking. 11/2: 4 Mins with only occ R UE touch assit and occ wood dowel RUE shoulder flexion to minmic casting a fishing rod.    Time 6    Period Weeks    Status Achieved    Target Date 08/15/21      PT LONG TERM GOAL #5   Title Pt will amb 100 ft with HW and CGA-MIN A with a gait belt donned for greater access to functional mobility in the home.    Baseline Pt is currently able to amb 20 Ft in the // bars with Mod A and extensive verbal and contact cueing to ensure safe mobility. 11/2: Pt was able to amb 40 ft with quad cane and CGA- Min A with extensive verbal cueing for proper LE sequencing.  1/9:  [facility]  Mod. A for safety/ BOS    Time 8    Period Weeks    Status Partially Met    Target Date 10/10/21      Additional Long Term Goals   Additional Long Term Goals Yes      PT LONG TERM GOAL #6   Title Pt. able to ascend/ descend 4 stairs with use of R UE assist/  handrail and step pattern with min. A safely to improve mobility outside of house.    Baseline Mod./max. A to ascend/ descend stairs with step to pattern.  Scissoring gait esp. with descending.    Time 8    Period Weeks     Status Partially Met    Target Date 10/10/21      PT LONG TERM GOAL #7   Title Pt. will be able to stand from chair with proper technique/ use of HW and maintain upright standing posture with SBA/mod. I to improve safety with transfers.    Baseline Pt. requires heavy UE assist (pulling on //-bars) to stand upright.  Limited currently with HW without PT assist.    Time 8    Period Weeks    Status New    Target Date 10/10/21                   Plan - 10/10/21 1903     Clinical Impression Statement Moderate L LE clonus during Nustep and unable to prevent with change of foot placement.  Clonus resolved with standing/ walking ex. in //-bars and supne ex./stretches.  Pt. states he has a L forearm/ hand brace arriving soon and will bring to next PT treatment.  L shoulder pain with PROM >90 deg. flexion/ abduction.  Pt. showed improvement with mod. I to CGA for sit to stand transfers from gray chair/ Nustep.  Pt. completes a 120 foot walk from gym to parking lot with use of hemiwalker and no rest breaks.  No LOB with gait training but extra time/ verbal cuing to correct BOS/ step pattern.    Personal Factors and Comorbidities Age;Comorbidity 3+    Examination-Activity Limitations Bathing;Reach Overhead;Stairs;Stand;Dressing;Bed Mobility;Hygiene/Grooming;Lift;Transfers;Carry;Locomotion Level    Examination-Participation Restrictions Occupation    Stability/Clinical Decision Making Evolving/Moderate complexity    Clinical Decision Making Moderate    Rehab Potential Fair    PT Frequency 2x / week    PT Duration 8 weeks    PT Treatment/Interventions ADLs/Self Care Home Management;Biofeedback;Electrical Stimulation;Cryotherapy;Moist Heat;Traction;Ultrasound;DME Instruction;Gait training;Stair training;Functional mobility training;Therapeutic activities;Neuromuscular re-education;Balance training;Therapeutic exercise;Patient/family education;Manual techniques;Wheelchair mobility training;Energy  conservation;Dry needling;Passive range of motion;Vestibular;Joint Manipulations;Spinal Manipulations    PT Next Visit Plan CHECK GOALS NEXT TX SESSION/ RECERT    PT Home Exercise Plan [email or web address]    Consulted and Agree with Plan of Care Patient;Family member/caregiver    Family Member Consulted Mother             Patient will benefit from skilled therapeutic intervention in order to improve the following deficits and impairments:  Abnormal gait, Cardiopulmonary status limiting activity, Decreased balance, Decreased activity tolerance, Decreased cognition, Decreased coordination, Decreased endurance, Decreased knowledge of use of DME, Decreased mobility, Decreased safety awareness, Decreased range of motion, Difficulty walking, Decreased strength, Hypomobility, Hypermobility, Increased muscle spasms, Impaired flexibility, Impaired perceived functional ability, Impaired tone, Impaired UE functional use, Postural dysfunction, Improper body mechanics, Pain  Visit Diagnosis: Hemiplegia and hemiparesis following cerebral infarction affecting left non-dominant side (HCC)  Muscle weakness (generalized)  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  Pura Spice, PT, DPT # [phone] 10/10/2021, 7:09 PM  [facility] [address] [place], [place], [address] Phone: [phone]   Fax:  [phone]  Name: DERRIUS FURTICK MRN: [id number] Date of Birth: 1975-08-12

## 2021-10-12 ENCOUNTER — Ambulatory Visit: Payer: Medicaid Other | Admitting: Physical Therapy

## 2021-10-17 ENCOUNTER — Ambulatory Visit: Payer: Medicaid Other | Admitting: Physical Therapy

## 2021-10-19 ENCOUNTER — Ambulatory Visit: Payer: Medicaid Other | Admitting: Physical Therapy

## 2023-02-26 ENCOUNTER — Encounter: Payer: Self-pay | Admitting: Physical Therapy

## 2023-02-26 ENCOUNTER — Ambulatory Visit: Payer: Medicare Other | Attending: Internal Medicine | Admitting: Physical Therapy

## 2023-02-26 DIAGNOSIS — Z7409 Other reduced mobility: Secondary | ICD-10-CM | POA: Insufficient documentation

## 2023-02-26 DIAGNOSIS — I69354 Hemiplegia and hemiparesis following cerebral infarction affecting left non-dominant side: Secondary | ICD-10-CM | POA: Insufficient documentation

## 2023-02-26 DIAGNOSIS — M6281 Muscle weakness (generalized): Secondary | ICD-10-CM | POA: Diagnosis present

## 2023-02-28 ENCOUNTER — Ambulatory Visit: Payer: Medicare Other | Admitting: Physical Therapy

## 2023-03-02 ENCOUNTER — Encounter: Payer: Medicare Other | Admitting: Physical Therapy

## 2023-03-03 NOTE — Therapy (Signed)
  OUTPATIENT PHYSICAL THERAPY NEURO EVALUATION   Patient Name: Barry Olson MRN: [id number] DOB:[birth year], 47 y.o., male Today's Date: 03/03/2023  Past Medical History:  Diagnosis Date   Back pain    Past Surgical History:  Procedure Laterality Date   HERNIA REPAIR       Cammie Mcgee, PT 03/03/2023, 4:19 PM

## 2023-03-04 NOTE — Addendum Note (Signed)
Addended by: Cammie Mcgee on: 03/04/2023 08:51 PM   Modules accepted: Orders

## 2023-03-05 ENCOUNTER — Ambulatory Visit: Payer: Medicare Other | Admitting: Physical Therapy

## 2023-03-07 ENCOUNTER — Ambulatory Visit: Payer: Medicare Other | Admitting: Physical Therapy

## 2023-03-12 ENCOUNTER — Ambulatory Visit: Payer: Medicare Other | Attending: Internal Medicine | Admitting: Physical Therapy

## 2023-03-12 ENCOUNTER — Encounter: Payer: Self-pay | Admitting: Physical Therapy

## 2023-03-12 DIAGNOSIS — M6281 Muscle weakness (generalized): Secondary | ICD-10-CM | POA: Diagnosis present

## 2023-03-12 DIAGNOSIS — I69354 Hemiplegia and hemiparesis following cerebral infarction affecting left non-dominant side: Secondary | ICD-10-CM | POA: Diagnosis present

## 2023-03-12 DIAGNOSIS — Z7409 Other reduced mobility: Secondary | ICD-10-CM | POA: Insufficient documentation

## 2023-03-12 NOTE — Therapy (Signed)
OUTPATIENT PHYSICAL THERAPY NEURO TREATMENT   Patient Name: Barry Olson MRN: [id number] DOB:July 21, 1976, 47 y.o., male Today's Date: 03/12/2023   PT End of Session - 03/12/23 1303     Visit Number 2    Number of Visits 16    Date for PT Re-Evaluation 04/23/23    PT Start Time 1303    PT Stop Time 1350    PT Time Calculation (min) 47 min             Past Medical History:  Diagnosis Date   Back pain    Past Surgical History:  Procedure Laterality Date   HERNIA REPAIR     There are no problems to display for this patient.   ONSET DATE: 06/06/20  REFERRING DIAG: L hemiparesis/ muscle weakness  THERAPY DIAG:  Hemiplegia and hemiparesis following cerebral infarction affecting left non-dominant side (HCC)  Muscle weakness (generalized)  Spastic hemiplegia of left nondominant side as late effect of cerebral infarction (HCC)  Decreased independence with transfers  Rationale for Evaluation and Treatment: Rehabilitation  SUBJECTIVE:                                                                                                                                                                                             SUBJECTIVE STATEMENT: Pt. Presents to clinic in w/c with history of intracranial hemorrhage with left hemiparesis and dysphagia post stroke.  Pt. Known to PT clinic with tx. In 2022/2023.  Pt. Living with mother and brother and requires max assistance with most daily tasks/ transfers for safely.  Pt. Has a motorized scooter at home and ramp access.  Pt. Using manual tx. With assist with family members.  Pt. Reports L shoulder/ back pain at rest.  Pt accompanied by: family member  PERTINENT HISTORY:  Subjective:   Barry Olson is a 47 y.o. male in today with issue with his left hand. He has been getting more contractured. He has left-sided hemiparesis from an intracranial bleed years ago.  Current Outpatient Medications  Medication Sig Dispense  Refill  amLODIPine (NORVASC) 5 MG tablet Take 1 tablet by mouth once daily 90 tablet 0  baclofen (LIORESAL) 5 mg tablet Take 1 tablet (5 mg total) by mouth nightly 30 tablet 5  gabapentin (NEURONTIN) 300 MG capsule Take 1 capsule by mouth twice daily 60 capsule 2  lisinopriL (ZESTRIL) 40 MG tablet Take 1 tablet by mouth once daily 90 tablet 0  cyanocobalamin (VITAMIN B12) 1000 MCG tablet Take 1 tablet (1,000 mcg total) by mouth once daily 30 tablet 1   No current facility-administered medications for this visit.  Allergies as of 12/07/2022  (No Known Allergies)   Past Medical History:  Diagnosis Date  Hypertension   No past surgical history on file.  No family history on file.  Social History: reports that he has never smoked. His smokeless tobacco use includes chew. He reports current alcohol use of about 6.0 standard drinks of alcohol per week. He reports that he does not use drugs.  Results for orders placed or performed in visit on 11/23/21  Comprehensive Metabolic Panel (CMP)  Result Value Ref Range  Glucose 89 70 - 110 mg/dL  Sodium 621 308 - 657 mmol/L  Potassium 3.6 3.6 - 5.1 mmol/L  Chloride 105 97 - 109 mmol/L  Carbon Dioxide (CO2) 28.6 22.0 - 32.0 mmol/L  Urea Nitrogen (BUN) 14 7 - 25 mg/dL  Creatinine 1.3 0.7 - 1.3 mg/dL  Glomerular Filtration Rate (eGFR) 72 >60 mL/min/1.73sq m  Calcium 9.6 8.7 - 10.3 mg/dL  AST 11 8 - 39 U/L  ALT 11 6 - 57 U/L  Alk Phos (alkaline Phosphatase) 108 (H) 34 - 104 U/L  Albumin 4.3 3.5 - 4.8 g/dL  Bilirubin, Total 0.4 0.3 - 1.2 mg/dL  Protein, Total 7.9 6.1 - 7.9 g/dL  A/G Ratio 1.2 1.0 - 5.0 gm/dL  CBC w/auto Differential (5 Part)  Result Value Ref Range  WBC (White Blood Cell Count) 6.6 4.1 - 10.2 10^3/uL  RBC (Red Blood Cell Count) 4.63 (L) 4.69 - 6.13 10^6/uL  Hemoglobin 13.1 (L) 14.1 - 18.1 gm/dL  Hematocrit 84.6 (L) 96.2 - 52.0 %  MCV (Mean Corpuscular Volume) 85.7 80.0 - 100.0 fl  MCH (Mean Corpuscular Hemoglobin) 28.3  27.0 - 31.2 pg  MCHC (Mean Corpuscular Hemoglobin Concentration) 33.0 32.0 - 36.0 gm/dL  Platelet Count 952 841 - 450 10^3/uL  RDW-CV (Red Cell Distribution Width) 13.8 11.6 - 14.8 %  MPV (Mean Platelet Volume) 9.8 9.4 - 12.4 fl  Neutrophils 3.36 1.50 - 7.80 10^3/uL  Lymphocytes 2.63 1.00 - 3.60 10^3/uL  Monocytes 0.38 0.00 - 1.50 10^3/uL  Eosinophils 0.15 0.00 - 0.55 10^3/uL  Basophils 0.06 0.00 - 0.09 10^3/uL  Neutrophil % 50.9 32.0 - 70.0 %  Lymphocyte % 39.8 10.0 - 50.0 %  Monocyte % 5.8 4.0 - 13.0 %  Eosinophil % 2.3 1.0 - 5.0 %  Basophil% 0.9 0.0 - 2.0 %  Immature Granulocyte % 0.3 <=0.7 %  Immature Granulocyte Count 0.02 <=0.06 10^3/L  Lipid Panel w/calc LDL  Result Value Ref Range  Cholesterol, Total 118 100 - 200 mg/dL  Triglyceride 61 35 - 324 mg/dL  HDL (High Density Lipoprotein) Cholesterol 35.5 29.0 - 71.0 mg/dL  LDL Calculated 70 0 - 130 mg/dL  VLDL Cholesterol 12 mg/dL  Cholesterol/HDL Ratio 3.3  PSA, Total (Screen)  Result Value Ref Range  PSA (Prostate Specific Antigen), Total 0.80 0.10 - 4.00 ng/mL  Narrative  Test results were determined with Beckman Coulter Hybritech Assay. Values obtained with different assay methods cannot be used interchangeably in serial testing. Assay results should not be interpreted as absolute evidence of the presence or absence of malignant disease   ROS:  General: No fever, chills or recent illness. No change in weight Skin: No skin lesions, growths, masses, rashes, pruritus  HEENT: No change in vision or hearing. No pain or difficulty with swallowing Respiratory: No cough or shortness of breath CV: No chest pain or palpitations GI: No pain, dyspepsia or change in bowel habits GU: No dysuria, frequency, or hesitancy MSK: No joint pain or injury  Neurological: Positive for left hemiparesis Objective:   There is no height or weight on file to calculate BMI.  BP (!) 140/100  Pulse 70  SpO2 98%   General: WD/WN male, in no  acute distress Neuro: CN grossly intact. Hemiparesis  Assessment/Plan:   Hemiparesis affecting left side as late effect of stroke (CMS/HHS-HCC) (primary encounter diagnosis) Primary hypertension Impaired mobility and ADLs  Assessment and Plan  1. Left hemiparesis. Says he is having more contracture. Will order physical therapy and Occupational Therapy to work with him. 2. Mobility assessment. Patient suffers from severe left hemiparesis of upper and lower extremities. This impairs the ability to perform activities of daily living such as feeding and bathing at home. A walker will not resolve his issue. The patient is unable to propel a standard wheelchair therefore a motorized wheelchair would allow the patient to perform all of his ADLs and be more mobile.   PAIN:  Are you having pain? Yes: NPRS scale: not rated/10 Pain location: L shoulder/ low back Pain description: N/A Aggravating factors: L shoulder PROM Relieving factors: positioning  PRECAUTIONS: Fall  RED FLAGS: None   WEIGHT BEARING RESTRICTIONS: No  FALLS: Has patient fallen in last 6 months? No  LIVING ENVIRONMENT: Lives with: lives with their family Lives in: House/apartment Stairs: No Has following equipment at home: Single point cane, Wheelchair (manual), and Ramped entry  PLOF: Independent before Stroke.  PATIENT GOALS: improve mobility at home/ power w/c for greater independence/ decrease L shoulder/ UE pain  OBJECTIVE:   DIAGNOSTIC FINDINGS: See MD notes  COGNITION: Overall cognitive status: Within functional limits for tasks assessed   SENSATION: Not tested  COORDINATION: NT  EDEMA:  Not measured  MUSCLE TONE: LLE: Moderate L UE: Moderate  POSTURE: rounded shoulders, forward head, increased thoracic kyphosis, and flexed trunk   LOWER/UPPER EXTREMITY ROM:     Supine L shoulder flexion PROM: 125 deg. (Pain), elbow flexion: 153 deg. AAROM, elbow ext. -18 deg. (Pain). L sh. ER 43 deg.  AAROM.  Pt. Has L hand splint and CPM to assist with ROM at home.    Supine L hip SLR: 68 deg. (Challenged)- significant quad lag. Supine L ankle DF: -28 deg. (No AROM)- pt. Has AFO but left at home.  L knee flexion 131 deg.   LOWER/UPPER EXTREMITY MMT:    No UE testing   R/L 4/4- Hip flexion 4/4- Hip abduction 4/4- Hip adduction 4/4- Knee extension 4/4- Knee flexion 4/4- Ankle dorsiflexion  BED MOBILITY:  Supine to sit Modified independence  TRANSFERS: Assistive device utilized: Wheelchair (manual)  Sit to stand: Max A Stand to sit: Mod A Chair to chair: Max A Floor: NT  RAMP:  Level of Assistance: NT Assistive device utilized: Wheelchair (manual) Ramp Comments: assist from caregiver  STAIRS: Level of Assistance: Unable to complete stairs  GAIT: Gait pattern: N/A Distance walked: N/A Assistive device utilized: N/A Level of assistance: N/A  FUNCTIONAL TESTS:  5 times sit to stand: TBD  PATIENT SURVEYS:  FOTO initial 34/ goal 48.    TODAY'S TREATMENT:  DATE: 03/12/2023  Subjective: Pt. reports to PT with no reports of pain or discomfort. Says he still tries to stretch his L UE at home and work on [employer].  There.ex.:    Nustep L3 10 min. B LE (moderate cuing to maintain L LE midline position).  NO L UE.  Discussed pts. Goals and weekend activities.    Seated marching/ LAQ (no ankle wts)- 20x.  Seated L shoulder AA/PROM (flexion, abduction, IR/ER)- 5x during rest breaks from walking.    Sit to stands from w/c (added Airex pad) into //-bars- 4x with heavy R UE assist/ min. A from PT for cuing and proper technique.  Pt. Prefers NBOS and needs cuing to increase L LE wt. Bearing.    Standing weight shifting L/R at // bars: 2 x 5 shifts to each side, 10 second holds. Heavy R UE support on // bars, min A from PT for stability and  cueing.  Standing cone taps at // bars: 1 x 5 tapping L LE. Min A for stability with heavy R UE support on // bars.  Gait training:   Amb. In //-bars with min. A from PT, heavy R UE support on //-bars with 2-point gait pattern.  Narrow BOS with short recip. Step pattern; limited heelstrike on L LE.  2 laps in //-bars prior to seated rest break x 3 times   PATIENT EDUCATION: Education details: Seated posture/ use of w/c Person educated: Patient Education method: Medical illustrator Education comprehension: verbalized understanding and returned demonstration  HOME EXERCISE PROGRAM: Will issue HEP next tx. session  GOALS: Goals reviewed with patient? Yes  SHORT TERM GOALS: Target date: 03/26/23  Pt will be independent with HEP in order to improve strength and decrease back pain in order to improve seated tolerance at home. Baseline:  Pt. Limited with ex./ see above for ROM/ MMT deficits Goal status: INITIAL   LONG TERM GOALS: Target date: 04/23/23  Pt will increase FOTO score to predicted value of 48 for improvement in self-reported function with ADL.  Baseline:  initial 34 Goal status: INITIAL  2.  Pt will tolerate 5 mins of standing in // bars with Min A to promote WB and decreased pressure to post pelvic in seated/supine posture  Baseline:  max. Assist in //-bars Goal status: INITIAL  3.  Pt. Will be fitted for power w/c to improve household/ community mobility.   Baseline: limited with manual w/c Goal status: INITIAL  4.  Pt. Able to safely transfer from bed to w/c with mod I safely to decrease need for caregiver assist/ improve independence.  Baseline: max. assist Goal status: INITIAL   ASSESSMENT:  CLINICAL IMPRESSION: Pt presents to PT with no reports of pain; good compliance with HEP. Today's session consisted of improving LE strength, standing/walking tolerance. Gait training emphasized weight shifting fully to L side, as well as maintaining wider BoS  with reciprocal gait pattern. Pt requires heavy R UE support throughout all standing/walking exercises as well as min A from PT for stability/cueing. L UE PROM exercises performed during rest breaks to improve flexibility and range of L shoulder complex. Pt will benefit from skilled PT to address deficits and promote safe independence with community and home related mobility and ADLs. Pt would benefit from power w/c referral to improve mobility at home/ community.    OBJECTIVE IMPAIRMENTS: Abnormal gait, decreased activity tolerance, decreased balance, decreased coordination, decreased endurance, decreased knowledge of use of DME, decreased mobility, difficulty walking, decreased ROM, decreased strength,  hypomobility, impaired flexibility, improper body mechanics, and pain.   ACTIVITY LIMITATIONS: carrying, lifting, standing, squatting, transfers, bed mobility, bathing, toileting, dressing, and locomotion level  PARTICIPATION LIMITATIONS: meal prep, cleaning, and community activity  PERSONAL FACTORS: Past/current experiences are also affecting patient's functional outcome.   REHAB POTENTIAL: Good  CLINICAL DECISION MAKING: Evolving/moderate complexity  EVALUATION COMPLEXITY: Moderate  PLAN:  PT FREQUENCY: 2x/week  PT DURATION: 8 weeks  PLANNED INTERVENTIONS: Therapeutic exercises, Therapeutic activity, Neuromuscular re-education, Balance training, Gait training, Patient/Family education, Self Care, Joint mobilization, Stair training, Visual/preceptual remediation/compensation, Orthotic/Fit training, DME instructions, Splintting, Taping, Manual therapy, and Re-evaluation  PLAN FOR NEXT SESSION: Issue HEP, progress standing/walking exercises as tolerated  Cammie Mcgee, PT, DPT # [phone] 03/12/2023, 2:46 PM

## 2023-03-14 ENCOUNTER — Ambulatory Visit: Payer: Medicare Other | Admitting: Physical Therapy

## 2023-03-19 ENCOUNTER — Ambulatory Visit: Payer: Medicare Other | Admitting: Physical Therapy

## 2023-03-19 ENCOUNTER — Encounter: Payer: Self-pay | Admitting: Physical Therapy

## 2023-03-19 DIAGNOSIS — I69354 Hemiplegia and hemiparesis following cerebral infarction affecting left non-dominant side: Secondary | ICD-10-CM

## 2023-03-19 DIAGNOSIS — Z7409 Other reduced mobility: Secondary | ICD-10-CM

## 2023-03-19 DIAGNOSIS — M6281 Muscle weakness (generalized): Secondary | ICD-10-CM

## 2023-03-19 NOTE — Therapy (Signed)
OUTPATIENT PHYSICAL THERAPY NEURO TREATMENT   Patient Name: Barry Olson MRN: [id number] DOB:January 09, 1976, 47 y.o., male Today's Date: 03/19/2023   PT End of Session - 03/19/23 1444     Visit Number 3    Number of Visits 16    Date for PT Re-Evaluation 04/23/23    PT Start Time 1300    PT Stop Time 1347    PT Time Calculation (min) 47 min    Equipment Utilized During Treatment Gait belt    Activity Tolerance Patient tolerated treatment well    Behavior During Therapy WFL for tasks assessed/performed              Past Medical History:  Diagnosis Date   Back pain    Past Surgical History:  Procedure Laterality Date   HERNIA REPAIR     There are no problems to display for this patient.   ONSET DATE: 06/06/20  REFERRING DIAG: L hemiparesis/ muscle weakness  THERAPY DIAG:  Hemiplegia and hemiparesis following cerebral infarction affecting left non-dominant side (HCC)  Muscle weakness (generalized)  Spastic hemiplegia of left nondominant side as late effect of cerebral infarction (HCC)  Decreased independence with transfers  Rationale for Evaluation and Treatment: Rehabilitation  SUBJECTIVE:                                                                                                                                                                                             SUBJECTIVE STATEMENT: Pt. Presents to clinic in w/c with history of intracranial hemorrhage with left hemiparesis and dysphagia post stroke.  Pt. Known to PT clinic with tx. In 2022/2023.  Pt. Living with mother and brother and requires max assistance with most daily tasks/ transfers for safely.  Pt. Has a motorized scooter at home and ramp access.  Pt. Using manual tx. With assist with family members.  Pt. Reports L shoulder/ back pain at rest.  Pt accompanied by: family member  PERTINENT HISTORY:  Subjective:   Barry Olson is a 47 y.o. male in today with issue with his left hand.  He has been getting more contractured. He has left-sided hemiparesis from an intracranial bleed years ago.  Current Outpatient Medications  Medication Sig Dispense Refill  amLODIPine (NORVASC) 5 MG tablet Take 1 tablet by mouth once daily 90 tablet 0  baclofen (LIORESAL) 5 mg tablet Take 1 tablet (5 mg total) by mouth nightly 30 tablet 5  gabapentin (NEURONTIN) 300 MG capsule Take 1 capsule by mouth twice daily 60 capsule 2  lisinopriL (ZESTRIL) 40 MG tablet Take 1 tablet by mouth once daily 90 tablet 0  cyanocobalamin (  VITAMIN B12) 1000 MCG tablet Take 1 tablet (1,000 mcg total) by mouth once daily 30 tablet 1   No current facility-administered medications for this visit.   Allergies as of 12/07/2022  (No Known Allergies)   Past Medical History:  Diagnosis Date  Hypertension   No past surgical history on file.  No family history on file.  Social History: reports that he has never smoked. His smokeless tobacco use includes chew. He reports current alcohol use of about 6.0 standard drinks of alcohol per week. He reports that he does not use drugs.  Results for orders placed or performed in visit on 11/23/21  Comprehensive Metabolic Panel (CMP)  Result Value Ref Range  Glucose 89 70 - 110 mg/dL  Sodium 161 096 - 045 mmol/L  Potassium 3.6 3.6 - 5.1 mmol/L  Chloride 105 97 - 109 mmol/L  Carbon Dioxide (CO2) 28.6 22.0 - 32.0 mmol/L  Urea Nitrogen (BUN) 14 7 - 25 mg/dL  Creatinine 1.3 0.7 - 1.3 mg/dL  Glomerular Filtration Rate (eGFR) 72 >60 mL/min/1.73sq m  Calcium 9.6 8.7 - 10.3 mg/dL  AST 11 8 - 39 U/L  ALT 11 6 - 57 U/L  Alk Phos (alkaline Phosphatase) 108 (H) 34 - 104 U/L  Albumin 4.3 3.5 - 4.8 g/dL  Bilirubin, Total 0.4 0.3 - 1.2 mg/dL  Protein, Total 7.9 6.1 - 7.9 g/dL  A/G Ratio 1.2 1.0 - 5.0 gm/dL  CBC w/auto Differential (5 Part)  Result Value Ref Range  WBC (White Blood Cell Count) 6.6 4.1 - 10.2 10^3/uL  RBC (Red Blood Cell Count) 4.63 (L) 4.69 - 6.13 10^6/uL   Hemoglobin 13.1 (L) 14.1 - 18.1 gm/dL  Hematocrit 40.9 (L) 81.1 - 52.0 %  MCV (Mean Corpuscular Volume) 85.7 80.0 - 100.0 fl  MCH (Mean Corpuscular Hemoglobin) 28.3 27.0 - 31.2 pg  MCHC (Mean Corpuscular Hemoglobin Concentration) 33.0 32.0 - 36.0 gm/dL  Platelet Count 914 782 - 450 10^3/uL  RDW-CV (Red Cell Distribution Width) 13.8 11.6 - 14.8 %  MPV (Mean Platelet Volume) 9.8 9.4 - 12.4 fl  Neutrophils 3.36 1.50 - 7.80 10^3/uL  Lymphocytes 2.63 1.00 - 3.60 10^3/uL  Monocytes 0.38 0.00 - 1.50 10^3/uL  Eosinophils 0.15 0.00 - 0.55 10^3/uL  Basophils 0.06 0.00 - 0.09 10^3/uL  Neutrophil % 50.9 32.0 - 70.0 %  Lymphocyte % 39.8 10.0 - 50.0 %  Monocyte % 5.8 4.0 - 13.0 %  Eosinophil % 2.3 1.0 - 5.0 %  Basophil% 0.9 0.0 - 2.0 %  Immature Granulocyte % 0.3 <=0.7 %  Immature Granulocyte Count 0.02 <=0.06 10^3/L  Lipid Panel w/calc LDL  Result Value Ref Range  Cholesterol, Total 118 100 - 200 mg/dL  Triglyceride 61 35 - 956 mg/dL  HDL (High Density Lipoprotein) Cholesterol 35.5 29.0 - 71.0 mg/dL  LDL Calculated 70 0 - 130 mg/dL  VLDL Cholesterol 12 mg/dL  Cholesterol/HDL Ratio 3.3  PSA, Total (Screen)  Result Value Ref Range  PSA (Prostate Specific Antigen), Total 0.80 0.10 - 4.00 ng/mL  Narrative  Test results were determined with Beckman Coulter Hybritech Assay. Values obtained with different assay methods cannot be used interchangeably in serial testing. Assay results should not be interpreted as absolute evidence of the presence or absence of malignant disease   ROS:  General: No fever, chills or recent illness. No change in weight Skin: No skin lesions, growths, masses, rashes, pruritus  HEENT: No change in vision or hearing. No pain or difficulty with swallowing Respiratory: No cough or shortness  of breath CV: No chest pain or palpitations GI: No pain, dyspepsia or change in bowel habits GU: No dysuria, frequency, or hesitancy MSK: No joint pain or injury Neurological:  Positive for left hemiparesis Objective:   There is no height or weight on file to calculate BMI.  BP (!) 140/100  Pulse 70  SpO2 98%   General: WD/WN male, in no acute distress Neuro: CN grossly intact. Hemiparesis  Assessment/Plan:   Hemiparesis affecting left side as late effect of stroke (CMS/HHS-HCC) (primary encounter diagnosis) Primary hypertension Impaired mobility and ADLs  Assessment and Plan  1. Left hemiparesis. Says he is having more contracture. Will order physical therapy and Occupational Therapy to work with him. 2. Mobility assessment. Patient suffers from severe left hemiparesis of upper and lower extremities. This impairs the ability to perform activities of daily living such as feeding and bathing at home. A walker will not resolve his issue. The patient is unable to propel a standard wheelchair therefore a motorized wheelchair would allow the patient to perform all of his ADLs and be more mobile.   PAIN:  Are you having pain? Yes: NPRS scale: not rated/10 Pain location: L shoulder/ low back Pain description: N/A Aggravating factors: L shoulder PROM Relieving factors: positioning  PRECAUTIONS: Fall  RED FLAGS: None   WEIGHT BEARING RESTRICTIONS: No  FALLS: Has patient fallen in last 6 months? No  LIVING ENVIRONMENT: Lives with: lives with their family Lives in: House/apartment Stairs: No Has following equipment at home: Single point cane, Wheelchair (manual), and Ramped entry  PLOF: Independent before Stroke.  PATIENT GOALS: improve mobility at home/ power w/c for greater independence/ decrease L shoulder/ UE pain  OBJECTIVE:   DIAGNOSTIC FINDINGS: See MD notes  COGNITION: Overall cognitive status: Within functional limits for tasks assessed   SENSATION: Not tested  COORDINATION: NT  EDEMA:  Not measured  MUSCLE TONE: LLE: Moderate L UE: Moderate  POSTURE: rounded shoulders, forward head, increased thoracic kyphosis, and flexed  trunk   LOWER/UPPER EXTREMITY ROM:     Supine L shoulder flexion PROM: 125 deg. (Pain), elbow flexion: 153 deg. AAROM, elbow ext. -18 deg. (Pain). L sh. ER 43 deg. AAROM.  Pt. Has L hand splint and CPM to assist with ROM at home.    Supine L hip SLR: 68 deg. (Challenged)- significant quad lag. Supine L ankle DF: -28 deg. (No AROM)- pt. Has AFO but left at home.  L knee flexion 131 deg.   LOWER/UPPER EXTREMITY MMT:    No UE testing   R/L 4/4- Hip flexion 4/4- Hip abduction 4/4- Hip adduction 4/4- Knee extension 4/4- Knee flexion 4/4- Ankle dorsiflexion  BED MOBILITY:  Supine to sit Modified independence  TRANSFERS: Assistive device utilized: Wheelchair (manual)  Sit to stand: Max A Stand to sit: Mod A Chair to chair: Max A Floor: NT  RAMP:  Level of Assistance: NT Assistive device utilized: Wheelchair (manual) Ramp Comments: assist from caregiver  STAIRS: Level of Assistance: Unable to complete stairs  GAIT: Gait pattern: N/A Distance walked: N/A Assistive device utilized: N/A Level of assistance: N/A  FUNCTIONAL TESTS:  5 times sit to stand: TBD  PATIENT SURVEYS:  FOTO initial 34/ goal 48.    TODAY'S TREATMENT:  DATE: 03/19/2023  Subjective: Pt. reports to PT with no reports of pain or discomfort. Pt reports still trying to do some stretching at home; HEP going well.   There.ex.:    Nustep L3 10 min. B LE (moderate cuing to maintain L LE midline position).  NO L UE.  Discussed pts. Goals and weekend activities.    Seated L shoulder AA/PROM (flexion, abduction, IR/ER)- 3x during rest breaks from walking.    Sit to stands from w/c (added Airex pad) into //-bars- 5x with heavy R UE assist/ min. A from PT for cuing and proper technique.  Pt. Prefers NBOS and needs cuing to increase L LE wt. Bearing.    Standing at // bars: weight  shifting L/R while moving cones from side-to-side with R UE x 3 bouts, min A from PT for stability and cueing.  Standing LE taps at // bars: 1 x 10 tapping R LE on foam pad, Min A for stability with heavy R UE support on // bars.  Gait training:   Amb. In //-bars with min. A from PT, heavy R UE support on //-bars with 2-point gait pattern.  Narrow BOS with short recip. Step pattern; limited heelstrike on L LE.  2 x 2 laps in //-bars, 1 seated rest break in between sets.   PATIENT EDUCATION: Education details: Geologist, engineering use of w/c Person educated: Patient Education method: Medical illustrator Education comprehension: verbalized understanding and returned demonstration  HOME EXERCISE PROGRAM: Will issue HEP next tx. session  GOALS: Goals reviewed with patient? Yes  SHORT TERM GOALS: Target date: 03/26/23  Pt will be independent with HEP in order to improve strength and decrease back pain in order to improve seated tolerance at home. Baseline:  Pt. Limited with ex./ see above for ROM/ MMT deficits Goal status: INITIAL   LONG TERM GOALS: Target date: 04/23/23  Pt will increase FOTO score to predicted value of 48 for improvement in self-reported function with ADL.  Baseline:  initial 34 Goal status: INITIAL  2.  Pt will tolerate 5 mins of standing in // bars with Min A to promote WB and decreased pressure to post pelvic in seated/supine posture  Baseline:  max. Assist in //-bars Goal status: INITIAL  3.  Pt. Will be fitted for power w/c to improve household/ community mobility.   Baseline: limited with manual w/c Goal status: INITIAL  4.  Pt. Able to safely transfer from bed to w/c with mod I safely to decrease need for caregiver assist/ improve independence.  Baseline: max. assist Goal status: INITIAL   ASSESSMENT:  CLINICAL IMPRESSION: Pt presents to PT with no reports of pain; good compliance with HEP and stretching. Today's session consisted of improving  LE strength, standing/walking tolerance. Gait training emphasized weight shifting fully to L side, maintaining wider BoS with reciprocal gait pattern, good heel strike. Pt requires heavy R UE support throughout standing/walking exercises as well as min A from PT for stability/cueing; no instances of LoB today. L UE PROM exercises performed during rest breaks to improve flexibility and range of L shoulder complex. Pt will benefit from skilled PT to address deficits and promote safe independence with community and home related mobility and ADLs. Pt would benefit from power w/c referral to improve mobility at home/ community.    OBJECTIVE IMPAIRMENTS: Abnormal gait, decreased activity tolerance, decreased balance, decreased coordination, decreased endurance, decreased knowledge of use of DME, decreased mobility, difficulty walking, decreased ROM, decreased strength, hypomobility, impaired flexibility, improper body  mechanics, and pain.   ACTIVITY LIMITATIONS: carrying, lifting, standing, squatting, transfers, bed mobility, bathing, toileting, dressing, and locomotion level  PARTICIPATION LIMITATIONS: meal prep, cleaning, and community activity  PERSONAL FACTORS: Past/current experiences are also affecting patient's functional outcome.   REHAB POTENTIAL: Good  CLINICAL DECISION MAKING: Evolving/moderate complexity  EVALUATION COMPLEXITY: Moderate  PLAN:  PT FREQUENCY: 2x/week  PT DURATION: 8 weeks  PLANNED INTERVENTIONS: Therapeutic exercises, Therapeutic activity, Neuromuscular re-education, Balance training, Gait training, Patient/Family education, Self Care, Joint mobilization, Stair training, Visual/preceptual remediation/compensation, Orthotic/Fit training, DME instructions, Splintting, Taping, Manual therapy, and Re-evaluation  PLAN FOR NEXT SESSION: Issue HEP, progress standing/walking exercises as tolerated  Cammie Mcgee, PT, DPT # [id number] Cena Benton, SPT 03/19/23, 2:55  PM

## 2023-03-21 ENCOUNTER — Encounter: Payer: Self-pay | Admitting: Physical Therapy

## 2023-03-21 ENCOUNTER — Ambulatory Visit: Payer: Medicare Other | Admitting: Physical Therapy

## 2023-03-21 DIAGNOSIS — Z7409 Other reduced mobility: Secondary | ICD-10-CM

## 2023-03-21 DIAGNOSIS — I69354 Hemiplegia and hemiparesis following cerebral infarction affecting left non-dominant side: Secondary | ICD-10-CM | POA: Diagnosis not present

## 2023-03-21 DIAGNOSIS — M6281 Muscle weakness (generalized): Secondary | ICD-10-CM

## 2023-03-21 NOTE — Therapy (Signed)
OUTPATIENT PHYSICAL THERAPY NEURO TREATMENT   Patient Name: Barry Olson MRN: [id number] DOB:1976/02/09, 47 y.o., male Today's Date: 03/21/2023   PT End of Session - 03/21/23 1250     Visit Number 4    Number of Visits 16    Date for PT Re-Evaluation 04/23/23    PT Start Time 1245    PT Stop Time 1347    PT Time Calculation (min) 62 min    Equipment Utilized During Treatment Gait belt    Activity Tolerance Patient tolerated treatment well    Behavior During Therapy WFL for tasks assessed/performed              Past Medical History:  Diagnosis Date   Back pain    Past Surgical History:  Procedure Laterality Date   HERNIA REPAIR     There are no problems to display for this patient.   ONSET DATE: 06/06/20  REFERRING DIAG: L hemiparesis/ muscle weakness  THERAPY DIAG:  Hemiplegia and hemiparesis following cerebral infarction affecting left non-dominant side (HCC)  Muscle weakness (generalized)  Spastic hemiplegia of left nondominant side as late effect of cerebral infarction (HCC)  Decreased independence with transfers  Rationale for Evaluation and Treatment: Rehabilitation  SUBJECTIVE:                                                                                                                                                                                             SUBJECTIVE STATEMENT: Pt. Presents to clinic in w/c with history of intracranial hemorrhage with left hemiparesis and dysphagia post stroke.  Pt. Known to PT clinic with tx. In 2022/2023.  Pt. Living with mother and brother and requires max assistance with most daily tasks/ transfers for safely.  Pt. Has a motorized scooter at home and ramp access.  Pt. Using manual tx. With assist with family members.  Pt. Reports L shoulder/ back pain at rest.  Pt accompanied by: family member  PERTINENT HISTORY:  Subjective:   Barry Olson is a 48 y.o. male in today with issue with his left hand.  He has been getting more contractured. He has left-sided hemiparesis from an intracranial bleed years ago.  Current Outpatient Medications  Medication Sig Dispense Refill  amLODIPine (NORVASC) 5 MG tablet Take 1 tablet by mouth once daily 90 tablet 0  baclofen (LIORESAL) 5 mg tablet Take 1 tablet (5 mg total) by mouth nightly 30 tablet 5  gabapentin (NEURONTIN) 300 MG capsule Take 1 capsule by mouth twice daily 60 capsule 2  lisinopriL (ZESTRIL) 40 MG tablet Take 1 tablet by mouth once daily 90 tablet 0  cyanocobalamin (  VITAMIN B12) 1000 MCG tablet Take 1 tablet (1,000 mcg total) by mouth once daily 30 tablet 1   No current facility-administered medications for this visit.   Allergies as of 12/07/2022  (No Known Allergies)   Past Medical History:  Diagnosis Date  Hypertension   No past surgical history on file.  No family history on file.  Social History: reports that he has never smoked. His smokeless tobacco use includes chew. He reports current alcohol use of about 6.0 standard drinks of alcohol per week. He reports that he does not use drugs.  Results for orders placed or performed in visit on 11/23/21  Comprehensive Metabolic Panel (CMP)  Result Value Ref Range  Glucose 89 70 - 110 mg/dL  Sodium 161 096 - 045 mmol/L  Potassium 3.6 3.6 - 5.1 mmol/L  Chloride 105 97 - 109 mmol/L  Carbon Dioxide (CO2) 28.6 22.0 - 32.0 mmol/L  Urea Nitrogen (BUN) 14 7 - 25 mg/dL  Creatinine 1.3 0.7 - 1.3 mg/dL  Glomerular Filtration Rate (eGFR) 72 >60 mL/min/1.73sq m  Calcium 9.6 8.7 - 10.3 mg/dL  AST 11 8 - 39 U/L  ALT 11 6 - 57 U/L  Alk Phos (alkaline Phosphatase) 108 (H) 34 - 104 U/L  Albumin 4.3 3.5 - 4.8 g/dL  Bilirubin, Total 0.4 0.3 - 1.2 mg/dL  Protein, Total 7.9 6.1 - 7.9 g/dL  A/G Ratio 1.2 1.0 - 5.0 gm/dL  CBC w/auto Differential (5 Part)  Result Value Ref Range  WBC (White Blood Cell Count) 6.6 4.1 - 10.2 10^3/uL  RBC (Red Blood Cell Count) 4.63 (L) 4.69 - 6.13 10^6/uL   Hemoglobin 13.1 (L) 14.1 - 18.1 gm/dL  Hematocrit 40.9 (L) 81.1 - 52.0 %  MCV (Mean Corpuscular Volume) 85.7 80.0 - 100.0 fl  MCH (Mean Corpuscular Hemoglobin) 28.3 27.0 - 31.2 pg  MCHC (Mean Corpuscular Hemoglobin Concentration) 33.0 32.0 - 36.0 gm/dL  Platelet Count 914 782 - 450 10^3/uL  RDW-CV (Red Cell Distribution Width) 13.8 11.6 - 14.8 %  MPV (Mean Platelet Volume) 9.8 9.4 - 12.4 fl  Neutrophils 3.36 1.50 - 7.80 10^3/uL  Lymphocytes 2.63 1.00 - 3.60 10^3/uL  Monocytes 0.38 0.00 - 1.50 10^3/uL  Eosinophils 0.15 0.00 - 0.55 10^3/uL  Basophils 0.06 0.00 - 0.09 10^3/uL  Neutrophil % 50.9 32.0 - 70.0 %  Lymphocyte % 39.8 10.0 - 50.0 %  Monocyte % 5.8 4.0 - 13.0 %  Eosinophil % 2.3 1.0 - 5.0 %  Basophil% 0.9 0.0 - 2.0 %  Immature Granulocyte % 0.3 <=0.7 %  Immature Granulocyte Count 0.02 <=0.06 10^3/L  Lipid Panel w/calc LDL  Result Value Ref Range  Cholesterol, Total 118 100 - 200 mg/dL  Triglyceride 61 35 - 956 mg/dL  HDL (High Density Lipoprotein) Cholesterol 35.5 29.0 - 71.0 mg/dL  LDL Calculated 70 0 - 130 mg/dL  VLDL Cholesterol 12 mg/dL  Cholesterol/HDL Ratio 3.3  PSA, Total (Screen)  Result Value Ref Range  PSA (Prostate Specific Antigen), Total 0.80 0.10 - 4.00 ng/mL  Narrative  Test results were determined with Beckman Coulter Hybritech Assay. Values obtained with different assay methods cannot be used interchangeably in serial testing. Assay results should not be interpreted as absolute evidence of the presence or absence of malignant disease   ROS:  General: No fever, chills or recent illness. No change in weight Skin: No skin lesions, growths, masses, rashes, pruritus  HEENT: No change in vision or hearing. No pain or difficulty with swallowing Respiratory: No cough or shortness  of breath CV: No chest pain or palpitations GI: No pain, dyspepsia or change in bowel habits GU: No dysuria, frequency, or hesitancy MSK: No joint pain or injury Neurological:  Positive for left hemiparesis Objective:   There is no height or weight on file to calculate BMI.  BP (!) 140/100  Pulse 70  SpO2 98%   General: WD/WN male, in no acute distress Neuro: CN grossly intact. Hemiparesis  Assessment/Plan:   Hemiparesis affecting left side as late effect of stroke (CMS/HHS-HCC) (primary encounter diagnosis) Primary hypertension Impaired mobility and ADLs  Assessment and Plan  1. Left hemiparesis. Says he is having more contracture. Will order physical therapy and Occupational Therapy to work with him. 2. Mobility assessment. Patient suffers from severe left hemiparesis of upper and lower extremities. This impairs the ability to perform activities of daily living such as feeding and bathing at home. A walker will not resolve his issue. The patient is unable to propel a standard wheelchair therefore a motorized wheelchair would allow the patient to perform all of his ADLs and be more mobile.   PAIN:  Are you having pain? Yes: NPRS scale: not rated/10 Pain location: L shoulder/ low back Pain description: N/A Aggravating factors: L shoulder PROM Relieving factors: positioning  PRECAUTIONS: Fall  RED FLAGS: None   WEIGHT BEARING RESTRICTIONS: No  FALLS: Has patient fallen in last 6 months? No  LIVING ENVIRONMENT: Lives with: lives with their family Lives in: House/apartment Stairs: No Has following equipment at home: Single point cane, Wheelchair (manual), and Ramped entry  PLOF: Independent before Stroke.  PATIENT GOALS: improve mobility at home/ power w/c for greater independence/ decrease L shoulder/ UE pain  OBJECTIVE:   DIAGNOSTIC FINDINGS: See MD notes  COGNITION: Overall cognitive status: Within functional limits for tasks assessed   SENSATION: Not tested  COORDINATION: NT  EDEMA:  Not measured  MUSCLE TONE: LLE: Moderate L UE: Moderate  POSTURE: rounded shoulders, forward head, increased thoracic kyphosis, and flexed  trunk   LOWER/UPPER EXTREMITY ROM:     Supine L shoulder flexion PROM: 125 deg. (Pain), elbow flexion: 153 deg. AAROM, elbow ext. -18 deg. (Pain). L sh. ER 43 deg. AAROM.  Pt. Has L hand splint and CPM to assist with ROM at home.    Supine L hip SLR: 68 deg. (Challenged)- significant quad lag. Supine L ankle DF: -28 deg. (No AROM)- pt. Has AFO but left at home.  L knee flexion 131 deg.   LOWER/UPPER EXTREMITY MMT:    No UE testing   R/L 4/4- Hip flexion 4/4- Hip abduction 4/4- Hip adduction 4/4- Knee extension 4/4- Knee flexion 4/4- Ankle dorsiflexion  BED MOBILITY:  Supine to sit Modified independence  TRANSFERS: Assistive device utilized: Wheelchair (manual)  Sit to stand: Max A Stand to sit: Mod A Chair to chair: Max A Floor: NT  RAMP:  Level of Assistance: NT Assistive device utilized: Wheelchair (manual) Ramp Comments: assist from caregiver  STAIRS: Level of Assistance: Unable to complete stairs  GAIT: Gait pattern: N/A Distance walked: N/A Assistive device utilized: N/A Level of assistance: N/A  FUNCTIONAL TESTS:  5 times sit to stand: TBD  PATIENT SURVEYS:  FOTO initial 34/ goal 48.    TODAY'S TREATMENT:  DATE: 03/21/2023  Subjective: Pt. reports to PT with no reports of pain or discomfort.  Pt. Reports no new complaints since last PT tx. Session.    There.ex.:    Nustep L4 10 min. B LE (moderate cuing to maintain L LE midline position).  Discussed use of hand splint at home and HEP.    Seated L shoulder AA/PROM (flexion, abduction, IR/ER)- 3x during rest breaks from walking.    Gait training:   Amb. In //-bars with min. A from PT, heavy R UE support on //-bars with 2-point gait pattern.  Narrow BOS with short recip. Step pattern; limited heelstrike on L LE.  2 x 2 laps in //-bars, Moderate cuing to correct R step length/ L  foot wt. Bearing on heel, not toe to decrease clonus.    Walking in //-bars with use of 3" wide plinth to promote consistent BOS with step pattern while using R UE on //-bars.  Mirror feedback for posture/ gait pattern.  2 laps with seated rest break required.    Walking in //-bars with 3" wide plinth step overs with use of R UE assist in //-bars.  Min. A/CGA for safety.  Large step with R LE to clear plinth.    Ambulate from //-bars to outside of building (65 feet) with use of HW and min. A for safety. Pt. Unable to amb. To car and required sitting in w/c due to fatigue.    Neuro. Mm.:  Standing LE taps at // bars: 1 x 10 tapping R LE on foam pad, Min A for stability with heavy R UE support on // bars.  Cuing to increase L LE wt. Bearing/ decrease UE assist.   Standing at // bars: weight shifting L/R while moving cones from side-to-side with R UE x 3 bouts, min A from PT for stability and cueing.  Occasional use of R UE on //-bars to prevent LOB.     PATIENT EDUCATION: Education details: Geologist, engineering use of w/c Person educated: Patient Education method: Medical illustrator Education comprehension: verbalized understanding and returned demonstration  HOME EXERCISE PROGRAM: Will issue HEP next tx. session  GOALS: Goals reviewed with patient? Yes  SHORT TERM GOALS: Target date: 03/26/23  Pt will be independent with HEP in order to improve strength and decrease back pain in order to improve seated tolerance at home. Baseline:  Pt. Limited with ex./ see above for ROM/ MMT deficits Goal status: INITIAL   LONG TERM GOALS: Target date: 04/23/23  Pt will increase FOTO score to predicted value of 48 for improvement in self-reported function with ADL.  Baseline:  initial 34 Goal status: INITIAL  2.  Pt will tolerate 5 mins of standing in // bars with Min A to promote WB and decreased pressure to post pelvic in seated/supine posture  Baseline:  max. Assist in //-bars Goal  status: INITIAL  3.  Pt. Will be fitted for power w/c to improve household/ community mobility.   Baseline: limited with manual w/c Goal status: INITIAL  4.  Pt. Able to safely transfer from bed to w/c with mod I safely to decrease need for caregiver assist/ improve independence.  Baseline: max. assist Goal status: INITIAL   ASSESSMENT:  CLINICAL IMPRESSION: Pt presents to PT with no reports of pain; good compliance with HEP and stretching. Today's session consisted of improving LE strength, standing/walking tolerance. Gait training emphasized weight shifting fully to L side, maintaining wider BoS with reciprocal gait pattern, good heel strike. Pt requires  heavy R UE support throughout standing/walking exercises as well as min A from PT for stability/cueing; no instances of LoB today.  Pt. Able to ambulate from //-bars to outside of building (65 feet) with use of HW and min. A for safety.   L UE PROM exercises performed during rest breaks to improve flexibility and range of L shoulder complex. Pt will benefit from skilled PT to address deficits and promote safe independence with community and home related mobility and ADLs. Pt would benefit from power w/c referral to improve mobility at home/ community.    OBJECTIVE IMPAIRMENTS: Abnormal gait, decreased activity tolerance, decreased balance, decreased coordination, decreased endurance, decreased knowledge of use of DME, decreased mobility, difficulty walking, decreased ROM, decreased strength, hypomobility, impaired flexibility, improper body mechanics, and pain.   ACTIVITY LIMITATIONS: carrying, lifting, standing, squatting, transfers, bed mobility, bathing, toileting, dressing, and locomotion level  PARTICIPATION LIMITATIONS: meal prep, cleaning, and community activity  PERSONAL FACTORS: Past/current experiences are also affecting patient's functional outcome.   REHAB POTENTIAL: Good  CLINICAL DECISION MAKING: Evolving/moderate  complexity  EVALUATION COMPLEXITY: Moderate  PLAN:  PT FREQUENCY: 2x/week  PT DURATION: 8 weeks  PLANNED INTERVENTIONS: Therapeutic exercises, Therapeutic activity, Neuromuscular re-education, Balance training, Gait training, Patient/Family education, Self Care, Joint mobilization, Stair training, Visual/preceptual remediation/compensation, Orthotic/Fit training, DME instructions, Splintting, Taping, Manual therapy, and Re-evaluation  PLAN FOR NEXT SESSION: Progress standing/walking exercises as tolerated.  Use of Hemiwalker.    Cammie Mcgee, PT, DPT # [id number] Cena Benton, SPT 03/21/23, 3:00 PM

## 2023-03-26 ENCOUNTER — Encounter: Payer: Self-pay | Admitting: Physical Therapy

## 2023-03-26 ENCOUNTER — Ambulatory Visit: Payer: Medicare Other

## 2023-03-26 DIAGNOSIS — M6281 Muscle weakness (generalized): Secondary | ICD-10-CM

## 2023-03-26 DIAGNOSIS — I69354 Hemiplegia and hemiparesis following cerebral infarction affecting left non-dominant side: Secondary | ICD-10-CM

## 2023-03-26 DIAGNOSIS — Z7409 Other reduced mobility: Secondary | ICD-10-CM

## 2023-03-26 NOTE — Therapy (Signed)
OUTPATIENT PHYSICAL THERAPY NEURO TREATMENT   Patient Name: Barry Olson MRN: [id number] DOB:1975-11-19, 47 y.o., male Today's Date: 03/26/2023   PT End of Session - 03/26/23 1347     Visit Number 5    Number of Visits 16    Date for PT Re-Evaluation 04/23/23    PT Start Time 1255    PT Stop Time 1345    PT Time Calculation (min) 50 min    Equipment Utilized During Treatment Gait belt    Activity Tolerance Patient tolerated treatment well    Behavior During Therapy WFL for tasks assessed/performed               Past Medical History:  Diagnosis Date   Back pain    Past Surgical History:  Procedure Laterality Date   HERNIA REPAIR     There are no problems to display for this patient.   ONSET DATE: 06/06/20  REFERRING DIAG: L hemiparesis/ muscle weakness  THERAPY DIAG:  Hemiplegia and hemiparesis following cerebral infarction affecting left non-dominant side (HCC)  Muscle weakness (generalized)  Spastic hemiplegia of left nondominant side as late effect of cerebral infarction (HCC)  Decreased independence with transfers  Rationale for Evaluation and Treatment: Rehabilitation  SUBJECTIVE:                                                                                                                                                                                             SUBJECTIVE STATEMENT: Pt. Presents to clinic in w/c with history of intracranial hemorrhage with left hemiparesis and dysphagia post stroke.  Pt. Known to PT clinic with tx. In 2022/2023.  Pt. Living with mother and brother and requires max assistance with most daily tasks/ transfers for safely.  Pt. Has a motorized scooter at home and ramp access.  Pt. Using manual tx. With assist with family members.  Pt. Reports L shoulder/ back pain at rest.  Pt accompanied by: family member  PERTINENT HISTORY:  Subjective:   Barry Olson is a 47 y.o. male in today with issue with his left  hand. He has been getting more contractured. He has left-sided hemiparesis from an intracranial bleed years ago.  Current Outpatient Medications  Medication Sig Dispense Refill  amLODIPine (NORVASC) 5 MG tablet Take 1 tablet by mouth once daily 90 tablet 0  baclofen (LIORESAL) 5 mg tablet Take 1 tablet (5 mg total) by mouth nightly 30 tablet 5  gabapentin (NEURONTIN) 300 MG capsule Take 1 capsule by mouth twice daily 60 capsule 2  lisinopriL (ZESTRIL) 40 MG tablet Take 1 tablet by mouth once daily 90 tablet 0  cyanocobalamin (VITAMIN B12) 1000 MCG tablet Take 1 tablet (1,000 mcg total) by mouth once daily 30 tablet 1   No current facility-administered medications for this visit.   Allergies as of 12/07/2022  (No Known Allergies)   Past Medical History:  Diagnosis Date  Hypertension   No past surgical history on file.  No family history on file.  Social History: reports that he has never smoked. His smokeless tobacco use includes chew. He reports current alcohol use of about 6.0 standard drinks of alcohol per week. He reports that he does not use drugs.  Results for orders placed or performed in visit on 11/23/21  Comprehensive Metabolic Panel (CMP)  Result Value Ref Range  Glucose 89 70 - 110 mg/dL  Sodium 469 629 - 528 mmol/L  Potassium 3.6 3.6 - 5.1 mmol/L  Chloride 105 97 - 109 mmol/L  Carbon Dioxide (CO2) 28.6 22.0 - 32.0 mmol/L  Urea Nitrogen (BUN) 14 7 - 25 mg/dL  Creatinine 1.3 0.7 - 1.3 mg/dL  Glomerular Filtration Rate (eGFR) 72 >60 mL/min/1.73sq m  Calcium 9.6 8.7 - 10.3 mg/dL  AST 11 8 - 39 U/L  ALT 11 6 - 57 U/L  Alk Phos (alkaline Phosphatase) 108 (H) 34 - 104 U/L  Albumin 4.3 3.5 - 4.8 g/dL  Bilirubin, Total 0.4 0.3 - 1.2 mg/dL  Protein, Total 7.9 6.1 - 7.9 g/dL  A/G Ratio 1.2 1.0 - 5.0 gm/dL  CBC w/auto Differential (5 Part)  Result Value Ref Range  WBC (White Blood Cell Count) 6.6 4.1 - 10.2 10^3/uL  RBC (Red Blood Cell Count) 4.63 (L) 4.69 - 6.13  10^6/uL  Hemoglobin 13.1 (L) 14.1 - 18.1 gm/dL  Hematocrit 41.3 (L) 24.4 - 52.0 %  MCV (Mean Corpuscular Volume) 85.7 80.0 - 100.0 fl  MCH (Mean Corpuscular Hemoglobin) 28.3 27.0 - 31.2 pg  MCHC (Mean Corpuscular Hemoglobin Concentration) 33.0 32.0 - 36.0 gm/dL  Platelet Count 010 272 - 450 10^3/uL  RDW-CV (Red Cell Distribution Width) 13.8 11.6 - 14.8 %  MPV (Mean Platelet Volume) 9.8 9.4 - 12.4 fl  Neutrophils 3.36 1.50 - 7.80 10^3/uL  Lymphocytes 2.63 1.00 - 3.60 10^3/uL  Monocytes 0.38 0.00 - 1.50 10^3/uL  Eosinophils 0.15 0.00 - 0.55 10^3/uL  Basophils 0.06 0.00 - 0.09 10^3/uL  Neutrophil % 50.9 32.0 - 70.0 %  Lymphocyte % 39.8 10.0 - 50.0 %  Monocyte % 5.8 4.0 - 13.0 %  Eosinophil % 2.3 1.0 - 5.0 %  Basophil% 0.9 0.0 - 2.0 %  Immature Granulocyte % 0.3 <=0.7 %  Immature Granulocyte Count 0.02 <=0.06 10^3/L  Lipid Panel w/calc LDL  Result Value Ref Range  Cholesterol, Total 118 100 - 200 mg/dL  Triglyceride 61 35 - 536 mg/dL  HDL (High Density Lipoprotein) Cholesterol 35.5 29.0 - 71.0 mg/dL  LDL Calculated 70 0 - 130 mg/dL  VLDL Cholesterol 12 mg/dL  Cholesterol/HDL Ratio 3.3  PSA, Total (Screen)  Result Value Ref Range  PSA (Prostate Specific Antigen), Total 0.80 0.10 - 4.00 ng/mL  Narrative  Test results were determined with Beckman Coulter Hybritech Assay. Values obtained with different assay methods cannot be used interchangeably in serial testing. Assay results should not be interpreted as absolute evidence of the presence or absence of malignant disease   ROS:  General: No fever, chills or recent illness. No change in weight Skin: No skin lesions, growths, masses, rashes, pruritus  HEENT: No change in vision or hearing. No pain or difficulty with swallowing Respiratory: No cough or  shortness of breath CV: No chest pain or palpitations GI: No pain, dyspepsia or change in bowel habits GU: No dysuria, frequency, or hesitancy MSK: No joint pain or  injury Neurological: Positive for left hemiparesis Objective:   There is no height or weight on file to calculate BMI.  BP (!) 140/100  Pulse 70  SpO2 98%   General: WD/WN male, in no acute distress Neuro: CN grossly intact. Hemiparesis  Assessment/Plan:   Hemiparesis affecting left side as late effect of stroke (CMS/HHS-HCC) (primary encounter diagnosis) Primary hypertension Impaired mobility and ADLs  Assessment and Plan  1. Left hemiparesis. Says he is having more contracture. Will order physical therapy and Occupational Therapy to work with him. 2. Mobility assessment. Patient suffers from severe left hemiparesis of upper and lower extremities. This impairs the ability to perform activities of daily living such as feeding and bathing at home. A walker will not resolve his issue. The patient is unable to propel a standard wheelchair therefore a motorized wheelchair would allow the patient to perform all of his ADLs and be more mobile.   PAIN:  Are you having pain? Yes: NPRS scale: not rated/10 Pain location: L shoulder/ low back Pain description: N/A Aggravating factors: L shoulder PROM Relieving factors: positioning  PRECAUTIONS: Fall  RED FLAGS: None   WEIGHT BEARING RESTRICTIONS: No  FALLS: Has patient fallen in last 6 months? No  LIVING ENVIRONMENT: Lives with: lives with their family Lives in: House/apartment Stairs: No Has following equipment at home: Single point cane, Wheelchair (manual), and Ramped entry  PLOF: Independent before Stroke.  PATIENT GOALS: improve mobility at home/ power w/c for greater independence/ decrease L shoulder/ UE pain  OBJECTIVE:   DIAGNOSTIC FINDINGS: See MD notes  COGNITION: Overall cognitive status: Within functional limits for tasks assessed   SENSATION: Not tested  COORDINATION: NT  EDEMA:  Not measured  MUSCLE TONE: LLE: Moderate L UE: Moderate  POSTURE: rounded shoulders, forward head, increased thoracic  kyphosis, and flexed trunk   LOWER/UPPER EXTREMITY ROM:     Supine L shoulder flexion PROM: 125 deg. (Pain), elbow flexion: 153 deg. AAROM, elbow ext. -18 deg. (Pain). L sh. ER 43 deg. AAROM.  Pt. Has L hand splint and CPM to assist with ROM at home.    Supine L hip SLR: 68 deg. (Challenged)- significant quad lag. Supine L ankle DF: -28 deg. (No AROM)- pt. Has AFO but left at home.  L knee flexion 131 deg.   LOWER/UPPER EXTREMITY MMT:    No UE testing   R/L 4/4- Hip flexion 4/4- Hip abduction 4/4- Hip adduction 4/4- Knee extension 4/4- Knee flexion 4/4- Ankle dorsiflexion  BED MOBILITY:  Supine to sit Modified independence  TRANSFERS: Assistive device utilized: Wheelchair (manual)  Sit to stand: Max A Stand to sit: Mod A Chair to chair: Max A Floor: NT  RAMP:  Level of Assistance: NT Assistive device utilized: Wheelchair (manual) Ramp Comments: assist from caregiver  STAIRS: Level of Assistance: Unable to complete stairs  GAIT: Gait pattern: N/A Distance walked: N/A Assistive device utilized: N/A Level of assistance: N/A  FUNCTIONAL TESTS:  5 times sit to stand: TBD  PATIENT SURVEYS:  FOTO initial 34/ goal 48.    TODAY'S TREATMENT:  DATE: 03/26/2023  Subjective: Pt reports doing well today. No pain this afternoon. Reports his L UE has felt a little tighter than usual the past day or so.  There.ex.:    Nustep L4 10 min. B LE (moderate cuing to maintain L LE midline position).  Seated L shoulder AA/PROM (flexion, abduction)- 3x during rest breaks from walking.    STS x5 interspersed throughout session from w/c: airex pad added for higher surface, cueing to try and push from w/c instead of pulling with R UE on // bars  Gait training:   Amb. In //-bars with min. A from PT, heavy R UE support on //-bars with 2-point gait pattern.   Narrow BOS with short recip. Step pattern; limited heelstrike on L LE.  2 x 2 laps in //-bars, Moderate cueing to correct R step length/ L foot wt. Bearing on heel, not toe to decrease clonus.    Walking in //-bars with 3" wide plinth step overs with use of R UE assist in //-bars: 3 laps,  CGA for safety.  Large step with R LE to clear plinth.  - mild L knee pain towards end of last lap  1 lap ambulation around clinic with HW in RUE (~50 feet) - CGA for safety  Ambulate from //-bars to outside of building (65 feet) with use of HW and min. A for safety. Pt able to make it all the way to car outside with no rest breaks.  Neuro. Mm.:  Standing at // bars: weight shifting L/R  and reaching outside BoS while moving cones from side-to-side with R UE x 3 bouts, min A from PT for stability and cueing.  Occasional use of R UE on //-bars to prevent LOB.    Not today: Walking in //-bars with use of 3" wide plinth to promote consistent BOS with step pattern while using R UE on //-bars.  Mirror feedback for posture/ gait pattern.  2 laps with seated rest break required.    Standing LE taps at // bars: 1 x 10 tapping R LE on foam pad, Min A for stability with heavy R UE support on // bars.  Cuing to increase L LE wt. Bearing/ decrease UE assist.   PATIENT EDUCATION: Education details: Seated posture/ use of w/c Person educated: Patient Education method: Medical illustrator Education comprehension: verbalized understanding and returned demonstration  HOME EXERCISE PROGRAM: Will issue HEP next tx. session  GOALS: Goals reviewed with patient? Yes  SHORT TERM GOALS: Target date: 03/26/23  Pt will be independent with HEP in order to improve strength and decrease back pain in order to improve seated tolerance at home. Baseline:  Pt. Limited with ex./ see above for ROM/ MMT deficits Goal status: INITIAL   LONG TERM GOALS: Target date: 04/23/23  Pt will increase FOTO score to predicted value  of 48 for improvement in self-reported function with ADL.  Baseline:  initial 34 Goal status: INITIAL  2.  Pt will tolerate 5 mins of standing in // bars with Min A to promote WB and decreased pressure to post pelvic in seated/supine posture  Baseline:  max. Assist in //-bars Goal status: INITIAL  3.  Pt. Will be fitted for power w/c to improve household/ community mobility.   Baseline: limited with manual w/c Goal status: INITIAL  4.  Pt. Able to safely transfer from bed to w/c with mod I safely to decrease need for caregiver assist/ improve independence.  Baseline: max. assist Goal status: INITIAL  ASSESSMENT:  CLINICAL IMPRESSION: Pt presents to PT with no reports of pain. Today's session consisted of exercises to improve standing/walking tolerance as well as LE strength. Gait training emphasizes weight shifting to LLE, proper heelstrike, and increased stance time on LLE with longer step length on RLE. Pt requires CGA throughout all standing/walking exercises for safety but demonstrates no losses of balance. Pt able to ambulate ~65 feet with hemiwalker from clinic all the way out to his car today.  L UE PROM exercises performed during rest breaks to improve flexibility and range of L shoulder complex. Pt will benefit from skilled PT to address deficits and promote safe independence with community and home related mobility and ADLs. Pt would benefit from power w/c referral to improve mobility at home/ community.    OBJECTIVE IMPAIRMENTS: Abnormal gait, decreased activity tolerance, decreased balance, decreased coordination, decreased endurance, decreased knowledge of use of DME, decreased mobility, difficulty walking, decreased ROM, decreased strength, hypomobility, impaired flexibility, improper body mechanics, and pain.   ACTIVITY LIMITATIONS: carrying, lifting, standing, squatting, transfers, bed mobility, bathing, toileting, dressing, and locomotion level  PARTICIPATION LIMITATIONS:  meal prep, cleaning, and community activity  PERSONAL FACTORS: Past/current experiences are also affecting patient's functional outcome.   REHAB POTENTIAL: Good  CLINICAL DECISION MAKING: Evolving/moderate complexity  EVALUATION COMPLEXITY: Moderate  PLAN:  PT FREQUENCY: 2x/week  PT DURATION: 8 weeks  PLANNED INTERVENTIONS: Therapeutic exercises, Therapeutic activity, Neuromuscular re-education, Balance training, Gait training, Patient/Family education, Self Care, Joint mobilization, Stair training, Visual/preceptual remediation/compensation, Orthotic/Fit training, DME instructions, Splintting, Taping, Manual therapy, and Re-evaluation  PLAN FOR NEXT SESSION: Progress standing/walking exercises as tolerated.  Use of Hemiwalker.    Raizel Wesolowski, SPT  Maylon Peppers, PT, DPT Physical Therapist - [facility]  03/26/23, 2:56 PM

## 2023-03-28 ENCOUNTER — Encounter: Payer: Medicare Other | Admitting: Physical Therapy

## 2023-03-30 ENCOUNTER — Ambulatory Visit: Payer: Medicare Other | Admitting: Physical Therapy

## 2023-03-30 ENCOUNTER — Encounter: Payer: Self-pay | Admitting: Physical Therapy

## 2023-03-30 DIAGNOSIS — M6281 Muscle weakness (generalized): Secondary | ICD-10-CM

## 2023-03-30 DIAGNOSIS — I69354 Hemiplegia and hemiparesis following cerebral infarction affecting left non-dominant side: Secondary | ICD-10-CM | POA: Diagnosis not present

## 2023-03-30 NOTE — Therapy (Signed)
OUTPATIENT PHYSICAL THERAPY NEURO TREATMENT   Patient Name: Barry Olson MRN: [id number] DOB:1975-09-14, 47 y.o., male Today's Date: 03/30/2023   PT End of Session - 03/30/23 1343     Visit Number 6    Number of Visits 16    Date for PT Re-Evaluation 04/23/23    PT Start Time 1338    PT Stop Time 1437    PT Time Calculation (min) 59 min    Equipment Utilized During Treatment Gait belt    Activity Tolerance Patient tolerated treatment well    Behavior During Therapy WFL for tasks assessed/performed            Past Medical History:  Diagnosis Date   Back pain    Past Surgical History:  Procedure Laterality Date   HERNIA REPAIR     There are no problems to display for this patient.   ONSET DATE: 06/06/20  REFERRING DIAG: L hemiparesis/ muscle weakness  THERAPY DIAG:  Hemiplegia and hemiparesis following cerebral infarction affecting left non-dominant side (HCC)  Muscle weakness (generalized)  Spastic hemiplegia of left nondominant side as late effect of cerebral infarction [facility])  Rationale for Evaluation and Treatment: Rehabilitation  SUBJECTIVE:                                                                                                                                                                                             SUBJECTIVE STATEMENT: Pt. Presents to clinic in w/c with history of intracranial hemorrhage with left hemiparesis and dysphagia post stroke.  Pt. Known to PT clinic with tx. In 2022/2023.  Pt. Living with mother and brother and requires max assistance with most daily tasks/ transfers for safely.  Pt. Has a motorized scooter at home and ramp access.  Pt. Using manual tx. With assist with family members.  Pt. Reports L shoulder/ back pain at rest.  Pt accompanied by: family member  PERTINENT HISTORY:  Subjective:   Barry Olson is a 47 y.o. male in today with issue with his left hand. He has been getting more contractured. He  has left-sided hemiparesis from an intracranial bleed years ago.  Current Outpatient Medications  Medication Sig Dispense Refill  amLODIPine (NORVASC) 5 MG tablet Take 1 tablet by mouth once daily 90 tablet 0  baclofen (LIORESAL) 5 mg tablet Take 1 tablet (5 mg total) by mouth nightly 30 tablet 5  gabapentin (NEURONTIN) 300 MG capsule Take 1 capsule by mouth twice daily 60 capsule 2  lisinopriL (ZESTRIL) 40 MG tablet Take 1 tablet by mouth once daily 90 tablet 0  cyanocobalamin (VITAMIN B12) 1000 MCG tablet Take 1  tablet (1,000 mcg total) by mouth once daily 30 tablet 1   No current facility-administered medications for this visit.   Allergies as of 12/07/2022  (No Known Allergies)   Past Medical History:  Diagnosis Date  Hypertension   No past surgical history on file.  No family history on file.  Social History: reports that he has never smoked. His smokeless tobacco use includes chew. He reports current alcohol use of about 6.0 standard drinks of alcohol per week. He reports that he does not use drugs.  Results for orders placed or performed in visit on 11/23/21  Comprehensive Metabolic Panel (CMP)  Result Value Ref Range  Glucose 89 70 - 110 mg/dL  Sodium 960 454 - 098 mmol/L  Potassium 3.6 3.6 - 5.1 mmol/L  Chloride 105 97 - 109 mmol/L  Carbon Dioxide (CO2) 28.6 22.0 - 32.0 mmol/L  Urea Nitrogen (BUN) 14 7 - 25 mg/dL  Creatinine 1.3 0.7 - 1.3 mg/dL  Glomerular Filtration Rate (eGFR) 72 >60 mL/min/1.73sq m  Calcium 9.6 8.7 - 10.3 mg/dL  AST 11 8 - 39 U/L  ALT 11 6 - 57 U/L  Alk Phos (alkaline Phosphatase) 108 (H) 34 - 104 U/L  Albumin 4.3 3.5 - 4.8 g/dL  Bilirubin, Total 0.4 0.3 - 1.2 mg/dL  Protein, Total 7.9 6.1 - 7.9 g/dL  A/G Ratio 1.2 1.0 - 5.0 gm/dL  CBC w/auto Differential (5 Part)  Result Value Ref Range  WBC (White Blood Cell Count) 6.6 4.1 - 10.2 10^3/uL  RBC (Red Blood Cell Count) 4.63 (L) 4.69 - 6.13 10^6/uL  Hemoglobin 13.1 (L) 14.1 - 18.1 gm/dL   Hematocrit 11.9 (L) 14.7 - 52.0 %  MCV (Mean Corpuscular Volume) 85.7 80.0 - 100.0 fl  MCH (Mean Corpuscular Hemoglobin) 28.3 27.0 - 31.2 pg  MCHC (Mean Corpuscular Hemoglobin Concentration) 33.0 32.0 - 36.0 gm/dL  Platelet Count 829 562 - 450 10^3/uL  RDW-CV (Red Cell Distribution Width) 13.8 11.6 - 14.8 %  MPV (Mean Platelet Volume) 9.8 9.4 - 12.4 fl  Neutrophils 3.36 1.50 - 7.80 10^3/uL  Lymphocytes 2.63 1.00 - 3.60 10^3/uL  Monocytes 0.38 0.00 - 1.50 10^3/uL  Eosinophils 0.15 0.00 - 0.55 10^3/uL  Basophils 0.06 0.00 - 0.09 10^3/uL  Neutrophil % 50.9 32.0 - 70.0 %  Lymphocyte % 39.8 10.0 - 50.0 %  Monocyte % 5.8 4.0 - 13.0 %  Eosinophil % 2.3 1.0 - 5.0 %  Basophil% 0.9 0.0 - 2.0 %  Immature Granulocyte % 0.3 <=0.7 %  Immature Granulocyte Count 0.02 <=0.06 10^3/L  Lipid Panel w/calc LDL  Result Value Ref Range  Cholesterol, Total 118 100 - 200 mg/dL  Triglyceride 61 35 - 130 mg/dL  HDL (High Density Lipoprotein) Cholesterol 35.5 29.0 - 71.0 mg/dL  LDL Calculated 70 0 - 130 mg/dL  VLDL Cholesterol 12 mg/dL  Cholesterol/HDL Ratio 3.3  PSA, Total (Screen)  Result Value Ref Range  PSA (Prostate Specific Antigen), Total 0.80 0.10 - 4.00 ng/mL  Narrative  Test results were determined with Beckman Coulter Hybritech Assay. Values obtained with different assay methods cannot be used interchangeably in serial testing. Assay results should not be interpreted as absolute evidence of the presence or absence of malignant disease   ROS:  General: No fever, chills or recent illness. No change in weight Skin: No skin lesions, growths, masses, rashes, pruritus  HEENT: No change in vision or hearing. No pain or difficulty with swallowing Respiratory: No cough or shortness of breath CV: No chest pain or  palpitations GI: No pain, dyspepsia or change in bowel habits GU: No dysuria, frequency, or hesitancy MSK: No joint pain or injury Neurological: Positive for left hemiparesis Objective:    There is no height or weight on file to calculate BMI.  BP (!) 140/100  Pulse 70  SpO2 98%   General: WD/WN male, in no acute distress Neuro: CN grossly intact. Hemiparesis  Assessment/Plan:   Hemiparesis affecting left side as late effect of stroke (CMS/HHS-HCC) (primary encounter diagnosis) Primary hypertension Impaired mobility and ADLs  Assessment and Plan  1. Left hemiparesis. Says he is having more contracture. Will order physical therapy and Occupational Therapy to work with him. 2. Mobility assessment. Patient suffers from severe left hemiparesis of upper and lower extremities. This impairs the ability to perform activities of daily living such as feeding and bathing at home. A walker will not resolve his issue. The patient is unable to propel a standard wheelchair therefore a motorized wheelchair would allow the patient to perform all of his ADLs and be more mobile.   PAIN:  Are you having pain? Yes: NPRS scale: not rated/10 Pain location: L shoulder/ low back Pain description: N/A Aggravating factors: L shoulder PROM Relieving factors: positioning  PRECAUTIONS: Fall  RED FLAGS: None   WEIGHT BEARING RESTRICTIONS: No  FALLS: Has patient fallen in last 6 months? No  LIVING ENVIRONMENT: Lives with: lives with their family Lives in: House/apartment Stairs: No Has following equipment at home: Single point cane, Wheelchair (manual), and Ramped entry  PLOF: Independent before Stroke.  PATIENT GOALS: improve mobility at home/ power w/c for greater independence/ decrease L shoulder/ UE pain  OBJECTIVE:   DIAGNOSTIC FINDINGS: See MD notes  COGNITION: Overall cognitive status: Within functional limits for tasks assessed   SENSATION: Not tested  COORDINATION: NT  EDEMA:  Not measured  MUSCLE TONE: LLE: Moderate L UE: Moderate  POSTURE: rounded shoulders, forward head, increased thoracic kyphosis, and flexed trunk   LOWER/UPPER EXTREMITY ROM:      Supine L shoulder flexion PROM: 125 deg. (Pain), elbow flexion: 153 deg. AAROM, elbow ext. -18 deg. (Pain). L sh. ER 43 deg. AAROM.  Pt. Has L hand splint and CPM to assist with ROM at home.    Supine L hip SLR: 68 deg. (Challenged)- significant quad lag. Supine L ankle DF: -28 deg. (No AROM)- pt. Has AFO but left at home.  L knee flexion 131 deg.   LOWER/UPPER EXTREMITY MMT:    No UE testing   R/L 4/4- Hip flexion 4/4- Hip abduction 4/4- Hip adduction 4/4- Knee extension 4/4- Knee flexion 4/4- Ankle dorsiflexion  BED MOBILITY:  Supine to sit Modified independence  TRANSFERS: Assistive device utilized: Wheelchair (manual)  Sit to stand: Max A Stand to sit: Mod A Chair to chair: Max A Floor: NT  RAMP:  Level of Assistance: NT Assistive device utilized: Wheelchair (manual) Ramp Comments: assist from caregiver  STAIRS: Level of Assistance: Unable to complete stairs  GAIT: Gait pattern: N/A Distance walked: N/A Assistive device utilized: N/A Level of assistance: N/A  FUNCTIONAL TESTS:  5 times sit to stand: TBD  PATIENT SURVEYS:  FOTO initial 34/ goal 48.    TODAY'S TREATMENT:  DATE: 03/30/2023  Subjective: Pt reports doing well today and no pain reported.  Pt. Arrived to PT in w/c and ready to work on standing/walking.     There.ex.:    Nustep L4 10 min. B LE (moderate cuing to maintain L LE midline position).  Seated L shoulder AA/PROM (flexion, abduction)- 3x during rest breaks from walking.    STS x5 interspersed throughout session from w/c: airex pad added for higher surface, cueing to try and push from w/c instead of pulling with R UE on // bars  Gait training:   1 lap ambulation around clinic with HW in RUE (~50 feet) - CGA for safety  Walking in //-bars with 3" wide plinth step overs with use of R UE assist in //-bars: 3  laps,  CGA for safety.  Large step with R LE to clear plinth.  - mild L knee pain towards end of last lap  Amb. In //-bars with min. A from PT, heavy R UE support on //-bars with 2-point gait pattern.  Narrow BOS with short recip. Step pattern; limited heelstrike on L LE.  2 x 2 laps in //-bars, Moderate cueing to correct R step length/ L foot wt. Bearing on heel, not toe to decrease clonus.    Ambulate outside of building from w/c to car with use of HW and min. A for safety. Pt able to make it all the way to car outside with no rest breaks. 62 feet/ down slight ramp at side of building.    Neuro. Mm.:  Standing at // bars: weight shifting L/R  and reaching outside BoS while moving cones from side-to-side with R UE x 3 bouts, min A from PT for stability and cueing.  Occasional use of R UE on //-bars to prevent LOB.  Functional reaching tasks with R UE.    Not today: Walking in //-bars with use of 3" wide plinth to promote consistent BOS with step pattern while using R UE on //-bars.  Mirror feedback for posture/ gait pattern.  2 laps with seated rest break required.    Standing LE taps at // bars: 1 x 10 tapping R LE on foam pad, Min A for stability with heavy R UE support on // bars.  Cuing to increase L LE wt. Bearing/ decrease UE assist.   PATIENT EDUCATION: Education details: Seated posture/ use of w/c Person educated: Patient Education method: Medical illustrator Education comprehension: verbalized understanding and returned demonstration  HOME EXERCISE PROGRAM: Will issue HEP next tx. session  GOALS: Goals reviewed with patient? Yes  SHORT TERM GOALS: Target date: 03/26/23  Pt will be independent with HEP in order to improve strength and decrease back pain in order to improve seated tolerance at home. Baseline:  Pt. Limited with ex./ see above for ROM/ MMT deficits Goal status: Partially met   LONG TERM GOALS: Target date: 04/23/23  Pt will increase FOTO score to  predicted value of 48 for improvement in self-reported function with ADL.  Baseline:  initial 34 Goal status: INITIAL  2.  Pt will tolerate 5 mins of standing in // bars with Min A to promote WB and decreased pressure to post pelvic in seated/supine posture  Baseline:  max. Assist in //-bars Goal status: INITIAL  3.  Pt. Will be fitted for power w/c to improve household/ community mobility.   Baseline: limited with manual w/c Goal status: INITIAL  4.  Pt. Able to safely transfer from bed to w/c with mod I  safely to decrease need for caregiver assist/ improve independence.  Baseline: max. assist Goal status: INITIAL   ASSESSMENT:  CLINICAL IMPRESSION: Pt presents to PT with no reports of pain. Today's session consisted of exercises to improve standing/walking tolerance as well as LE strength. Gait training emphasizes weight shifting to LLE, proper heelstrike, and increased stance time on LLE with longer step length on RLE. Pt requires CGA throughout all standing/walking exercises for safety but demonstrates no losses of balance. Pt able to ambulate with hemiwalker from clinic all the way out to his car today.  L UE PROM exercises performed during rest breaks to improve flexibility and range of L shoulder complex. Pt will benefit from skilled PT to address deficits and promote safe independence with community and home related mobility and ADLs. Pt would benefit from power w/c referral to improve mobility at home/ community.    OBJECTIVE IMPAIRMENTS: Abnormal gait, decreased activity tolerance, decreased balance, decreased coordination, decreased endurance, decreased knowledge of use of DME, decreased mobility, difficulty walking, decreased ROM, decreased strength, hypomobility, impaired flexibility, improper body mechanics, and pain.   ACTIVITY LIMITATIONS: carrying, lifting, standing, squatting, transfers, bed mobility, bathing, toileting, dressing, and locomotion level  PARTICIPATION  LIMITATIONS: meal prep, cleaning, and community activity  PERSONAL FACTORS: Past/current experiences are also affecting patient's functional outcome.   REHAB POTENTIAL: Good  CLINICAL DECISION MAKING: Evolving/moderate complexity  EVALUATION COMPLEXITY: Moderate  PLAN:  PT FREQUENCY: 2x/week  PT DURATION: 8 weeks  PLANNED INTERVENTIONS: Therapeutic exercises, Therapeutic activity, Neuromuscular re-education, Balance training, Gait training, Patient/Family education, Self Care, Joint mobilization, Stair training, Visual/preceptual remediation/compensation, Orthotic/Fit training, DME instructions, Splintting, Taping, Manual therapy, and Re-evaluation  PLAN FOR NEXT SESSION: Progress standing/walking exercises as tolerated.  Use of Hemiwalker.    Cammie Mcgee, PT, DPT # [id number] Cena Benton, SPT Physical Therapist - [facility]  03/30/23, 2:49 PM

## 2023-04-02 ENCOUNTER — Encounter: Payer: Medicare Other | Admitting: Physical Therapy

## 2023-04-04 ENCOUNTER — Ambulatory Visit: Payer: Medicare Other | Admitting: Physical Therapy

## 2023-04-04 ENCOUNTER — Encounter: Payer: Self-pay | Admitting: Physical Therapy

## 2023-04-04 DIAGNOSIS — I69354 Hemiplegia and hemiparesis following cerebral infarction affecting left non-dominant side: Secondary | ICD-10-CM | POA: Diagnosis not present

## 2023-04-04 DIAGNOSIS — Z7409 Other reduced mobility: Secondary | ICD-10-CM

## 2023-04-04 DIAGNOSIS — M6281 Muscle weakness (generalized): Secondary | ICD-10-CM

## 2023-04-04 NOTE — Therapy (Signed)
OUTPATIENT PHYSICAL THERAPY NEURO TREATMENT   Patient Name: Barry Olson MRN: [id number] DOB:Oct 25, 1975, 47 y.o., male Today's Date: 04/04/2023   PT End of Session - 04/04/23 1257     Visit Number 7    Number of Visits 16    Date for PT Re-Evaluation 04/23/23    PT Start Time 1255    PT Stop Time 1355    PT Time Calculation (min) 60 min    Equipment Utilized During Treatment Gait belt    Activity Tolerance Patient tolerated treatment well    Behavior During Therapy WFL for tasks assessed/performed             Past Medical History:  Diagnosis Date   Back pain    Past Surgical History:  Procedure Laterality Date   HERNIA REPAIR     There are no problems to display for this patient.   ONSET DATE: 06/06/20  REFERRING DIAG: L hemiparesis/ muscle weakness  THERAPY DIAG:  Hemiplegia and hemiparesis following cerebral infarction affecting left non-dominant side (HCC)  Muscle weakness (generalized)  Spastic hemiplegia of left nondominant side as late effect of cerebral infarction (HCC)  Decreased independence with transfers  Rationale for Evaluation and Treatment: Rehabilitation  SUBJECTIVE:                                                                                                                                                                                             SUBJECTIVE STATEMENT: Pt. Presents to clinic in w/c with history of intracranial hemorrhage with left hemiparesis and dysphagia post stroke.  Pt. Known to PT clinic with tx. In 2022/2023.  Pt. Living with mother and brother and requires max assistance with most daily tasks/ transfers for safely.  Pt. Has a motorized scooter at home and ramp access.  Pt. Using manual tx. With assist with family members.  Pt. Reports L shoulder/ back pain at rest.  Pt accompanied by: family member  PERTINENT HISTORY:  Subjective:   Barry Olson is a 47 y.o. male in today with issue with his left hand.  He has been getting more contractured. He has left-sided hemiparesis from an intracranial bleed years ago.  Current Outpatient Medications  Medication Sig Dispense Refill  amLODIPine (NORVASC) 5 MG tablet Take 1 tablet by mouth once daily 90 tablet 0  baclofen (LIORESAL) 5 mg tablet Take 1 tablet (5 mg total) by mouth nightly 30 tablet 5  gabapentin (NEURONTIN) 300 MG capsule Take 1 capsule by mouth twice daily 60 capsule 2  lisinopriL (ZESTRIL) 40 MG tablet Take 1 tablet by mouth once daily 90 tablet 0  cyanocobalamin (VITAMIN  B12) 1000 MCG tablet Take 1 tablet (1,000 mcg total) by mouth once daily 30 tablet 1   No current facility-administered medications for this visit.   Allergies as of 12/07/2022  (No Known Allergies)   Past Medical History:  Diagnosis Date  Hypertension   No past surgical history on file.  No family history on file.  Social History: reports that he has never smoked. His smokeless tobacco use includes chew. He reports current alcohol use of about 6.0 standard drinks of alcohol per week. He reports that he does not use drugs.  Results for orders placed or performed in visit on 11/23/21  Comprehensive Metabolic Panel (CMP)  Result Value Ref Range  Glucose 89 70 - 110 mg/dL  Sodium 161 096 - 045 mmol/L  Potassium 3.6 3.6 - 5.1 mmol/L  Chloride 105 97 - 109 mmol/L  Carbon Dioxide (CO2) 28.6 22.0 - 32.0 mmol/L  Urea Nitrogen (BUN) 14 7 - 25 mg/dL  Creatinine 1.3 0.7 - 1.3 mg/dL  Glomerular Filtration Rate (eGFR) 72 >60 mL/min/1.73sq m  Calcium 9.6 8.7 - 10.3 mg/dL  AST 11 8 - 39 U/L  ALT 11 6 - 57 U/L  Alk Phos (alkaline Phosphatase) 108 (H) 34 - 104 U/L  Albumin 4.3 3.5 - 4.8 g/dL  Bilirubin, Total 0.4 0.3 - 1.2 mg/dL  Protein, Total 7.9 6.1 - 7.9 g/dL  A/G Ratio 1.2 1.0 - 5.0 gm/dL  CBC w/auto Differential (5 Part)  Result Value Ref Range  WBC (White Blood Cell Count) 6.6 4.1 - 10.2 10^3/uL  RBC (Red Blood Cell Count) 4.63 (L) 4.69 - 6.13 10^6/uL   Hemoglobin 13.1 (L) 14.1 - 18.1 gm/dL  Hematocrit 40.9 (L) 81.1 - 52.0 %  MCV (Mean Corpuscular Volume) 85.7 80.0 - 100.0 fl  MCH (Mean Corpuscular Hemoglobin) 28.3 27.0 - 31.2 pg  MCHC (Mean Corpuscular Hemoglobin Concentration) 33.0 32.0 - 36.0 gm/dL  Platelet Count 914 782 - 450 10^3/uL  RDW-CV (Red Cell Distribution Width) 13.8 11.6 - 14.8 %  MPV (Mean Platelet Volume) 9.8 9.4 - 12.4 fl  Neutrophils 3.36 1.50 - 7.80 10^3/uL  Lymphocytes 2.63 1.00 - 3.60 10^3/uL  Monocytes 0.38 0.00 - 1.50 10^3/uL  Eosinophils 0.15 0.00 - 0.55 10^3/uL  Basophils 0.06 0.00 - 0.09 10^3/uL  Neutrophil % 50.9 32.0 - 70.0 %  Lymphocyte % 39.8 10.0 - 50.0 %  Monocyte % 5.8 4.0 - 13.0 %  Eosinophil % 2.3 1.0 - 5.0 %  Basophil% 0.9 0.0 - 2.0 %  Immature Granulocyte % 0.3 <=0.7 %  Immature Granulocyte Count 0.02 <=0.06 10^3/L  Lipid Panel w/calc LDL  Result Value Ref Range  Cholesterol, Total 118 100 - 200 mg/dL  Triglyceride 61 35 - 956 mg/dL  HDL (High Density Lipoprotein) Cholesterol 35.5 29.0 - 71.0 mg/dL  LDL Calculated 70 0 - 130 mg/dL  VLDL Cholesterol 12 mg/dL  Cholesterol/HDL Ratio 3.3  PSA, Total (Screen)  Result Value Ref Range  PSA (Prostate Specific Antigen), Total 0.80 0.10 - 4.00 ng/mL  Narrative  Test results were determined with Beckman Coulter Hybritech Assay. Values obtained with different assay methods cannot be used interchangeably in serial testing. Assay results should not be interpreted as absolute evidence of the presence or absence of malignant disease   ROS:  General: No fever, chills or recent illness. No change in weight Skin: No skin lesions, growths, masses, rashes, pruritus  HEENT: No change in vision or hearing. No pain or difficulty with swallowing Respiratory: No cough or shortness of  breath CV: No chest pain or palpitations GI: No pain, dyspepsia or change in bowel habits GU: No dysuria, frequency, or hesitancy MSK: No joint pain or injury Neurological:  Positive for left hemiparesis Objective:   There is no height or weight on file to calculate BMI.  BP (!) 140/100  Pulse 70  SpO2 98%   General: WD/WN male, in no acute distress Neuro: CN grossly intact. Hemiparesis  Assessment/Plan:   Hemiparesis affecting left side as late effect of stroke (CMS/HHS-HCC) (primary encounter diagnosis) Primary hypertension Impaired mobility and ADLs  Assessment and Plan  1. Left hemiparesis. Says he is having more contracture. Will order physical therapy and Occupational Therapy to work with him. 2. Mobility assessment. Patient suffers from severe left hemiparesis of upper and lower extremities. This impairs the ability to perform activities of daily living such as feeding and bathing at home. A walker will not resolve his issue. The patient is unable to propel a standard wheelchair therefore a motorized wheelchair would allow the patient to perform all of his ADLs and be more mobile.   PAIN:  Are you having pain? Yes: NPRS scale: not rated/10 Pain location: L shoulder/ low back Pain description: N/A Aggravating factors: L shoulder PROM Relieving factors: positioning  PRECAUTIONS: Fall  RED FLAGS: None   WEIGHT BEARING RESTRICTIONS: No  FALLS: Has patient fallen in last 6 months? No  LIVING ENVIRONMENT: Lives with: lives with their family Lives in: House/apartment Stairs: No Has following equipment at home: Single point cane, Wheelchair (manual), and Ramped entry  PLOF: Independent before Stroke.  PATIENT GOALS: improve mobility at home/ power w/c for greater independence/ decrease L shoulder/ UE pain  OBJECTIVE:   DIAGNOSTIC FINDINGS: See MD notes  COGNITION: Overall cognitive status: Within functional limits for tasks assessed   SENSATION: Not tested  COORDINATION: NT  EDEMA:  Not measured  MUSCLE TONE: LLE: Moderate L UE: Moderate  POSTURE: rounded shoulders, forward head, increased thoracic kyphosis, and flexed  trunk   LOWER/UPPER EXTREMITY ROM:     Supine L shoulder flexion PROM: 125 deg. (Pain), elbow flexion: 153 deg. AAROM, elbow ext. -18 deg. (Pain). L sh. ER 43 deg. AAROM.  Pt. Has L hand splint and CPM to assist with ROM at home.    Supine L hip SLR: 68 deg. (Challenged)- significant quad lag. Supine L ankle DF: -28 deg. (No AROM)- pt. Has AFO but left at home.  L knee flexion 131 deg.   LOWER/UPPER EXTREMITY MMT:    No UE testing   R/L 4/4- Hip flexion 4/4- Hip abduction 4/4- Hip adduction 4/4- Knee extension 4/4- Knee flexion 4/4- Ankle dorsiflexion  BED MOBILITY:  Supine to sit Modified independence  TRANSFERS: Assistive device utilized: Wheelchair (manual)  Sit to stand: Max A Stand to sit: Mod A Chair to chair: Max A Floor: NT  RAMP:  Level of Assistance: NT Assistive device utilized: Wheelchair (manual) Ramp Comments: assist from caregiver  STAIRS: Level of Assistance: Unable to complete stairs  GAIT: Gait pattern: N/A Distance walked: N/A Assistive device utilized: N/A Level of assistance: N/A  FUNCTIONAL TESTS:  5 times sit to stand: TBD  PATIENT SURVEYS:  FOTO initial 34/ goal 48.    TODAY'S TREATMENT:  DATE: 04/04/2023  Subjective: Pt reports doing well today, mild L knee and thigh pain . Pt reports HEP still going well at home; he hasn't had a chance to do any walking at home with the hemiwalker yet.  There.ex.:    Nustep L4 10 min. B LE (moderate cuing to maintain L LE midline position).  Seated L shoulder AA/PROM (flexion, abduction)- 3x during rest breaks from walking.    STS x5 interspersed throughout session from w/c: airex pad added for higher surface, cueing to try and push from w/c instead of pulling with R UE on // bars  Gait training:   1 lap ambulation around clinic with HW in RUE (~50 feet) - CGA for  safety  Walking in //-bars with 12 " step overs with use of R UE assist in //-bars: 2  laps,  CGA for safety.  Large step with R LE to clear plinth.  - mild L ankle pain towards end of last lap  Walking in //-bars with use of 3" wide plinth to promote consistent wide BOS with step pattern while using R UE on //-bars.  Mirror feedback for posture/ gait pattern.  4 laps with seated rest break required halfway.   Ambulate outside of building from w/c to car with use of HW and min. A for safety. Pt able to make it all the way to car outside with no rest breaks ~50 feet  Neuro. Mm.:  Standing at // bars: weight shifting forward and reaching outside BoS to pick up cones from lower surface with R UE x 3 bouts of 5 cones, min A from PT for stability and cueing. Occasional use of R UE on //-bars to prevent posterior LOB.  Functional reaching tasks with R UE.    Alternating foot cone taps: 2x10 taps each foot with unilateral UE support on // bars, CGA throughout for safety, VC's for weightbearing through L heel    Not today:  Standing LE taps at // bars: 1 x 10 tapping R LE on foam pad, Min A for stability with heavy R UE support on // bars.  Cuing to increase L LE wt. Bearing/ decrease UE assist.      PATIENT EDUCATION: Education details: Seated posture/ use of w/c Person educated: Patient Education method: Medical illustrator Education comprehension: verbalized understanding and returned demonstration  HOME EXERCISE PROGRAM: Will issue HEP next tx. session  GOALS: Goals reviewed with patient? Yes  SHORT TERM GOALS: Target date: 03/26/23  Pt will be independent with HEP in order to improve strength and decrease back pain in order to improve seated tolerance at home. Baseline:  Pt. Limited with ex./ see above for ROM/ MMT deficits Goal status: Partially met   LONG TERM GOALS: Target date: 04/23/23  Pt will increase FOTO score to predicted value of 48 for improvement in  self-reported function with ADL.  Baseline:  initial 34 Goal status: INITIAL  2.  Pt will tolerate 5 mins of standing in // bars with Min A to promote WB and decreased pressure to post pelvic in seated/supine posture  Baseline:  max. Assist in //-bars Goal status: INITIAL  3.  Pt. Will be fitted for power w/c to improve household/ community mobility.   Baseline: limited with manual w/c Goal status: INITIAL  4.  Pt. Able to safely transfer from bed to w/c with mod I safely to decrease need for caregiver assist/ improve independence.  Baseline: max. assist Goal status: INITIAL   ASSESSMENT:  CLINICAL  IMPRESSION: Pt presents to PT with reports of mild L knee and thigh pain. Today's session consisted of gait training with hemiwalker and within parallel bars. Exercises worked on reciprocal gait pattern emphasizing heel strike on L side as well as walking with consistently wide BoS. Pt continues to require several seated therapeutic rest breaks in between sets secondary to increase pain in his LLE. Static weight shifting and cone tapping exercises performed to work on improving static stability with functional tasks. Pt educated to try and use hemiwalker for home mobility as able to improve overall standing/walking endurance. Pt will benefit from skilled PT to address deficits and promote safe independence with community and home related mobility and ADLs. Pt would benefit from power w/c referral to improve mobility at home/ community.    OBJECTIVE IMPAIRMENTS: Abnormal gait, decreased activity tolerance, decreased balance, decreased coordination, decreased endurance, decreased knowledge of use of DME, decreased mobility, difficulty walking, decreased ROM, decreased strength, hypomobility, impaired flexibility, improper body mechanics, and pain.   ACTIVITY LIMITATIONS: carrying, lifting, standing, squatting, transfers, bed mobility, bathing, toileting, dressing, and locomotion  level  PARTICIPATION LIMITATIONS: meal prep, cleaning, and community activity  PERSONAL FACTORS: Past/current experiences are also affecting patient's functional outcome.   REHAB POTENTIAL: Good  CLINICAL DECISION MAKING: Evolving/moderate complexity  EVALUATION COMPLEXITY: Moderate  PLAN:  PT FREQUENCY: 2x/week  PT DURATION: 8 weeks  PLANNED INTERVENTIONS: Therapeutic exercises, Therapeutic activity, Neuromuscular re-education, Balance training, Gait training, Patient/Family education, Self Care, Joint mobilization, Stair training, Visual/preceptual remediation/compensation, Orthotic/Fit training, DME instructions, Splintting, Taping, Manual therapy, and Re-evaluation  PLAN FOR NEXT SESSION: Progress standing/walking exercises as tolerated.  Use of Hemiwalker.     Cammie Mcgee, PT, DPT # [phone] [place], SPT 04/04/23, 2:01 PM

## 2023-04-11 ENCOUNTER — Encounter: Payer: Self-pay | Admitting: Physical Therapy

## 2023-04-11 ENCOUNTER — Ambulatory Visit: Payer: Medicare Other | Attending: Internal Medicine | Admitting: Physical Therapy

## 2023-04-11 DIAGNOSIS — I69354 Hemiplegia and hemiparesis following cerebral infarction affecting left non-dominant side: Secondary | ICD-10-CM | POA: Insufficient documentation

## 2023-04-11 DIAGNOSIS — M6281 Muscle weakness (generalized): Secondary | ICD-10-CM | POA: Insufficient documentation

## 2023-04-11 DIAGNOSIS — Z7409 Other reduced mobility: Secondary | ICD-10-CM | POA: Insufficient documentation

## 2023-04-11 NOTE — Therapy (Signed)
OUTPATIENT PHYSICAL THERAPY NEURO TREATMENT   Patient Name: Barry Olson MRN: [id number] DOB:[birth year], 47 y.o., male Today's Date: 04/11/2023   PT End of Session - 04/11/23 1305     Visit Number 8    Number of Visits 16    Date for PT Re-Evaluation 04/23/23    PT Start Time 1300    PT Stop Time 1352    PT Time Calculation (min) 52 min    Equipment Utilized During Treatment Gait belt    Activity Tolerance Patient tolerated treatment well    Behavior During Therapy WFL for tasks assessed/performed              Past Medical History:  Diagnosis Date   Back pain    Past Surgical History:  Procedure Laterality Date   HERNIA REPAIR     There are no problems to display for this patient.   ONSET DATE: 06/06/20  REFERRING DIAG: L hemiparesis/ muscle weakness  THERAPY DIAG:  Hemiplegia and hemiparesis following cerebral infarction affecting left non-dominant side (HCC)  Muscle weakness (generalized)  Spastic hemiplegia of left nondominant side as late effect of cerebral infarction (HCC)  Decreased independence with transfers  Rationale for Evaluation and Treatment: Rehabilitation  SUBJECTIVE:                                                                                                                                                                                             SUBJECTIVE STATEMENT: Pt. Presents to clinic in w/c with history of intracranial hemorrhage with left hemiparesis and dysphagia post stroke.  Pt. Known to PT clinic with tx. In 2022/2023.  Pt. Living with mother and brother and requires max assistance with most daily tasks/ transfers for safely.  Pt. Has a motorized scooter at home and ramp access.  Pt. Using manual tx. With assist with family members.  Pt. Reports L shoulder/ back pain at rest.  Pt accompanied by: family member  PERTINENT HISTORY:  Subjective:   Barry Olson is a 47 y.o. male in today with issue with his left hand.  He has been getting more contractured. He has left-sided hemiparesis from an intracranial bleed years ago.  Current Outpatient Medications  Medication Sig Dispense Refill  amLODIPine (NORVASC) 5 MG tablet Take 1 tablet by mouth once daily 90 tablet 0  baclofen (LIORESAL) 5 mg tablet Take 1 tablet (5 mg total) by mouth nightly 30 tablet 5  gabapentin (NEURONTIN) 300 MG capsule Take 1 capsule by mouth twice daily 60 capsule 2  lisinopriL (ZESTRIL) 40 MG tablet Take 1 tablet by mouth once daily 90 tablet 0  cyanocobalamin (  VITAMIN B12) 1000 MCG tablet Take 1 tablet (1,000 mcg total) by mouth once daily 30 tablet 1   No current facility-administered medications for this visit.   Allergies as of 12/07/2022  (No Known Allergies)   Past Medical History:  Diagnosis Date  Hypertension   No past surgical history on file.  No family history on file.  Social History: reports that he has never smoked. His smokeless tobacco use includes chew. He reports current alcohol use of about 6.0 standard drinks of alcohol per week. He reports that he does not use drugs.  Results for orders placed or performed in visit on 11/23/21  Comprehensive Metabolic Panel (CMP)  Result Value Ref Range  Glucose 89 70 - 110 mg/dL  Sodium 962 952 - 841 mmol/L  Potassium 3.6 3.6 - 5.1 mmol/L  Chloride 105 97 - 109 mmol/L  Carbon Dioxide (CO2) 28.6 22.0 - 32.0 mmol/L  Urea Nitrogen (BUN) 14 7 - 25 mg/dL  Creatinine 1.3 0.7 - 1.3 mg/dL  Glomerular Filtration Rate (eGFR) 72 >60 mL/min/1.73sq m  Calcium 9.6 8.7 - 10.3 mg/dL  AST 11 8 - 39 U/L  ALT 11 6 - 57 U/L  Alk Phos (alkaline Phosphatase) 108 (H) 34 - 104 U/L  Albumin 4.3 3.5 - 4.8 g/dL  Bilirubin, Total 0.4 0.3 - 1.2 mg/dL  Protein, Total 7.9 6.1 - 7.9 g/dL  A/G Ratio 1.2 1.0 - 5.0 gm/dL  CBC w/auto Differential (5 Part)  Result Value Ref Range  WBC (White Blood Cell Count) 6.6 4.1 - 10.2 10^3/uL  RBC (Red Blood Cell Count) 4.63 (L) 4.69 - 6.13 10^6/uL   Hemoglobin 13.1 (L) 14.1 - 18.1 gm/dL  Hematocrit 32.4 (L) 40.1 - 52.0 %  MCV (Mean Corpuscular Volume) 85.7 80.0 - 100.0 fl  MCH (Mean Corpuscular Hemoglobin) 28.3 27.0 - 31.2 pg  MCHC (Mean Corpuscular Hemoglobin Concentration) 33.0 32.0 - 36.0 gm/dL  Platelet Count 027 253 - 450 10^3/uL  RDW-CV (Red Cell Distribution Width) 13.8 11.6 - 14.8 %  MPV (Mean Platelet Volume) 9.8 9.4 - 12.4 fl  Neutrophils 3.36 1.50 - 7.80 10^3/uL  Lymphocytes 2.63 1.00 - 3.60 10^3/uL  Monocytes 0.38 0.00 - 1.50 10^3/uL  Eosinophils 0.15 0.00 - 0.55 10^3/uL  Basophils 0.06 0.00 - 0.09 10^3/uL  Neutrophil % 50.9 32.0 - 70.0 %  Lymphocyte % 39.8 10.0 - 50.0 %  Monocyte % 5.8 4.0 - 13.0 %  Eosinophil % 2.3 1.0 - 5.0 %  Basophil% 0.9 0.0 - 2.0 %  Immature Granulocyte % 0.3 <=0.7 %  Immature Granulocyte Count 0.02 <=0.06 10^3/L  Lipid Panel w/calc LDL  Result Value Ref Range  Cholesterol, Total 118 100 - 200 mg/dL  Triglyceride 61 35 - 664 mg/dL  HDL (High Density Lipoprotein) Cholesterol 35.5 29.0 - 71.0 mg/dL  LDL Calculated 70 0 - 130 mg/dL  VLDL Cholesterol 12 mg/dL  Cholesterol/HDL Ratio 3.3  PSA, Total (Screen)  Result Value Ref Range  PSA (Prostate Specific Antigen), Total 0.80 0.10 - 4.00 ng/mL  Narrative  Test results were determined with Beckman Coulter Hybritech Assay. Values obtained with different assay methods cannot be used interchangeably in serial testing. Assay results should not be interpreted as absolute evidence of the presence or absence of malignant disease   ROS:  General: No fever, chills or recent illness. No change in weight Skin: No skin lesions, growths, masses, rashes, pruritus  HEENT: No change in vision or hearing. No pain or difficulty with swallowing Respiratory: No cough or shortness  of breath CV: No chest pain or palpitations GI: No pain, dyspepsia or change in bowel habits GU: No dysuria, frequency, or hesitancy MSK: No joint pain or injury Neurological:  Positive for left hemiparesis Objective:   There is no height or weight on file to calculate BMI.  BP (!) 140/100  Pulse 70  SpO2 98%   General: WD/WN male, in no acute distress Neuro: CN grossly intact. Hemiparesis  Assessment/Plan:   Hemiparesis affecting left side as late effect of stroke (CMS/HHS-HCC) (primary encounter diagnosis) Primary hypertension Impaired mobility and ADLs  Assessment and Plan  1. Left hemiparesis. Says he is having more contracture. Will order physical therapy and Occupational Therapy to work with him. 2. Mobility assessment. Patient suffers from severe left hemiparesis of upper and lower extremities. This impairs the ability to perform activities of daily living such as feeding and bathing at home. A walker will not resolve his issue. The patient is unable to propel a standard wheelchair therefore a motorized wheelchair would allow the patient to perform all of his ADLs and be more mobile.   PAIN:  Are you having pain? Yes: NPRS scale: not rated/10 Pain location: L shoulder/ low back Pain description: N/A Aggravating factors: L shoulder PROM Relieving factors: positioning  PRECAUTIONS: Fall  RED FLAGS: None   WEIGHT BEARING RESTRICTIONS: No  FALLS: Has patient fallen in last 6 months? No  LIVING ENVIRONMENT: Lives with: lives with their family Lives in: House/apartment Stairs: No Has following equipment at home: Single point cane, Wheelchair (manual), and Ramped entry  PLOF: Independent before Stroke.  PATIENT GOALS: improve mobility at home/ power w/c for greater independence/ decrease L shoulder/ UE pain  OBJECTIVE:   DIAGNOSTIC FINDINGS: See MD notes  COGNITION: Overall cognitive status: Within functional limits for tasks assessed   SENSATION: Not tested  COORDINATION: NT  EDEMA:  Not measured  MUSCLE TONE: LLE: Moderate L UE: Moderate  POSTURE: rounded shoulders, forward head, increased thoracic kyphosis, and flexed  trunk   LOWER/UPPER EXTREMITY ROM:     Supine L shoulder flexion PROM: 125 deg. (Pain), elbow flexion: 153 deg. AAROM, elbow ext. -18 deg. (Pain). L sh. ER 43 deg. AAROM.  Pt. Has L hand splint and CPM to assist with ROM at home.    Supine L hip SLR: 68 deg. (Challenged)- significant quad lag. Supine L ankle DF: -28 deg. (No AROM)- pt. Has AFO but left at home.  L knee flexion 131 deg.   LOWER/UPPER EXTREMITY MMT:    No UE testing   R/L 4/4- Hip flexion 4/4- Hip abduction 4/4- Hip adduction 4/4- Knee extension 4/4- Knee flexion 4/4- Ankle dorsiflexion  BED MOBILITY:  Supine to sit Modified independence  TRANSFERS: Assistive device utilized: Wheelchair (manual)  Sit to stand: Max A Stand to sit: Mod A Chair to chair: Max A Floor: NT  RAMP:  Level of Assistance: NT Assistive device utilized: Wheelchair (manual) Ramp Comments: assist from caregiver  STAIRS: Level of Assistance: Unable to complete stairs  GAIT: Gait pattern: N/A Distance walked: N/A Assistive device utilized: N/A Level of assistance: N/A  FUNCTIONAL TESTS:  5 times sit to stand: TBD  PATIENT SURVEYS:  FOTO initial 34/ goal 48.    TODAY'S TREATMENT:  DATE: 04/11/2023  Subjective: Pt reports doing well today, mild L ankle and arm pain. Pt reports not being able to try walking with hemi-walker at home.    There.ex.:    Nustep L4 10 min. B LE (moderate cuing to maintain L LE midline position).  Seated L shoulder AA/PROM (flexion, abduction)- 2x during rest breaks from walking.    STS x5 interspersed throughout session from chair: cueing to try and push from armrests instead of pulling with R UE on // bars  Gait training:      Walking in //-bars with 6 " step overs with use of R UE assist in //-bars: 4  laps,  CGA for safety, stepping over with both LE's - cues for putting  weight through L heel when stepping with R LE to avoid clonus  Walking in //-bars with use of 3" wide plinth to promote consistent wide BOS with step pattern while using R UE on //-bars.  Mirror feedback for posture/ gait pattern, 3 laps - pt demonstrated improved BoS width today  Ambulate outside of building from w/c to car with use of HW and min. A for safety. Pt able to make it all the way to car outside with no rest breaks ~50 feet x 2  Neuro. Mm.:  Standing at // bars: weight shifting forward and reaching outside BoS to pick up cones from lower surface with R UE x 3 bouts of 6 cones, CGA, Occasional use of R UE on //-bars to prevent posterior LOB.  Functional reaching tasks with R UE.     Not today: 1 lap ambulation around clinic with HW in RUE (~50 feet) - CGA for safety Standing LE taps at // bars: 1 x 10 tapping R LE on foam pad, Min A for stability with heavy R UE support on // bars.  Cuing to increase L LE wt. Bearing/ decrease UE assist.  Alternating foot cone taps: 2x10 taps each foot with unilateral UE support on // bars, CGA throughout for safety, VC's for weightbearing through L heel    PATIENT EDUCATION: Education details: Seated posture/ use of w/c Person educated: Patient Education method: Medical illustrator Education comprehension: verbalized understanding and returned demonstration  HOME EXERCISE PROGRAM: Will issue HEP next tx. session  GOALS: Goals reviewed with patient? Yes  SHORT TERM GOALS: Target date: 03/26/23  Pt will be independent with HEP in order to improve strength and decrease back pain in order to improve seated tolerance at home. Baseline:  Pt. Limited with ex./ see above for ROM/ MMT deficits Goal status: Partially met   LONG TERM GOALS: Target date: 04/23/23  Pt will increase FOTO score to predicted value of 48 for improvement in self-reported function with ADL.  Baseline:  initial 34 Goal status: INITIAL  2.  Pt will tolerate 5  mins of standing in // bars with Min A to promote WB and decreased pressure to post pelvic in seated/supine posture  Baseline:  max. Assist in //-bars Goal status: INITIAL  3.  Pt. Will be fitted for power w/c to improve household/ community mobility.   Baseline: limited with manual w/c Goal status: INITIAL  4.  Pt. Able to safely transfer from bed to w/c with mod I safely to decrease need for caregiver assist/ improve independence.  Baseline: max. assist Goal status: INITIAL   ASSESSMENT:  CLINICAL IMPRESSION: Pt presents to PT with reports of mild L knee and ankle pain. Session today consisted of gait training exercises to work  on ambulation with wider BoS, emphasizing heel-strike on LLE, navigating hurdles/obstacles. Static balance exercises implemented as well to work on functional reaching tasks and maintaining balance outside BoS. Sessions going forward will try to emphasize pt ambulating to/from car with hemi-walker in order to improve walking tolerance. Pt will benefit from skilled PT to address deficits and promote safe independence with community and home related mobility and ADLs. Pt would benefit from power w/c referral to improve mobility at home/ community.    OBJECTIVE IMPAIRMENTS: Abnormal gait, decreased activity tolerance, decreased balance, decreased coordination, decreased endurance, decreased knowledge of use of DME, decreased mobility, difficulty walking, decreased ROM, decreased strength, hypomobility, impaired flexibility, improper body mechanics, and pain.   ACTIVITY LIMITATIONS: carrying, lifting, standing, squatting, transfers, bed mobility, bathing, toileting, dressing, and locomotion level  PARTICIPATION LIMITATIONS: meal prep, cleaning, and community activity  PERSONAL FACTORS: Past/current experiences are also affecting patient's functional outcome.   REHAB POTENTIAL: Good  CLINICAL DECISION MAKING: Evolving/moderate complexity  EVALUATION COMPLEXITY:  Moderate  PLAN:  PT FREQUENCY: 2x/week  PT DURATION: 8 weeks  PLANNED INTERVENTIONS: Therapeutic exercises, Therapeutic activity, Neuromuscular re-education, Balance training, Gait training, Patient/Family education, Self Care, Joint mobilization, Stair training, Visual/preceptual remediation/compensation, Orthotic/Fit training, DME instructions, Splintting, Taping, Manual therapy, and Re-evaluation  PLAN FOR NEXT SESSION: Progress standing/walking exercises as tolerated.  Use of Hemiwalker.    Cammie Mcgee, PT, DPT # [phone] Cena Benton, SPT 04/11/23, 2:05 PM

## 2023-04-17 ENCOUNTER — Ambulatory Visit: Payer: Medicare Other | Admitting: Physical Therapy

## 2023-04-20 ENCOUNTER — Ambulatory Visit: Payer: Medicare Other | Admitting: Physical Therapy

## 2023-04-20 DIAGNOSIS — M6281 Muscle weakness (generalized): Secondary | ICD-10-CM

## 2023-04-20 DIAGNOSIS — I69354 Hemiplegia and hemiparesis following cerebral infarction affecting left non-dominant side: Secondary | ICD-10-CM

## 2023-04-20 DIAGNOSIS — Z7409 Other reduced mobility: Secondary | ICD-10-CM

## 2023-04-20 NOTE — Therapy (Signed)
OUTPATIENT PHYSICAL THERAPY NEURO TREATMENT   Patient Name: Barry Olson MRN: [id number] DOB:10-Feb-1976, 47 y.o., male Today's Date: 04/20/2023   PT End of Session - 04/20/23 1256     Visit Number 9    Number of Visits 16    Date for PT Re-Evaluation 04/23/23    PT Start Time 1250    Equipment Utilized During Treatment Gait belt    Activity Tolerance Patient tolerated treatment well    Behavior During Therapy [facility] for tasks assessed/performed            1250 to 1404 (74 minutes)  Past Medical History:  Diagnosis Date   Back pain    Past Surgical History:  Procedure Laterality Date   HERNIA REPAIR     There are no problems to display for this patient.   ONSET DATE: 06/06/20  REFERRING DIAG: L hemiparesis/ muscle weakness  THERAPY DIAG:  Hemiplegia and hemiparesis following cerebral infarction affecting left non-dominant side (HCC)  Muscle weakness (generalized)  Spastic hemiplegia of left nondominant side as late effect of cerebral infarction (HCC)  Decreased independence with transfers  Rationale for Evaluation and Treatment: Rehabilitation  SUBJECTIVE:                                                                                                                                                                                             SUBJECTIVE STATEMENT: Pt. Presents to clinic in w/c with history of intracranial hemorrhage with left hemiparesis and dysphagia post stroke.  Pt. Known to PT clinic with tx. In 2022/2023.  Pt. Living with mother and brother and requires max assistance with most daily tasks/ transfers for safely.  Pt. Has a motorized scooter at home and ramp access.  Pt. Using manual tx. With assist with family members.  Pt. Reports L shoulder/ back pain at rest.  Pt accompanied by: family member  PERTINENT HISTORY:  Subjective:   Barry Olson is a 47 y.o. male in today with issue with his left hand. He has been getting more  contractured. He has left-sided hemiparesis from an intracranial bleed years ago.  Current Outpatient Medications  Medication Sig Dispense Refill  amLODIPine (NORVASC) 5 MG tablet Take 1 tablet by mouth once daily 90 tablet 0  baclofen (LIORESAL) 5 mg tablet Take 1 tablet (5 mg total) by mouth nightly 30 tablet 5  gabapentin (NEURONTIN) 300 MG capsule Take 1 capsule by mouth twice daily 60 capsule 2  lisinopriL (ZESTRIL) 40 MG tablet Take 1 tablet by mouth once daily 90 tablet 0  cyanocobalamin (VITAMIN B12) 1000 MCG tablet Take 1 tablet (1,000 mcg total) by  mouth once daily 30 tablet 1   No current facility-administered medications for this visit.   Allergies as of 12/07/2022  (No Known Allergies)   Past Medical History:  Diagnosis Date  Hypertension   No past surgical history on file.  No family history on file.  Social History: reports that he has never smoked. His smokeless tobacco use includes chew. He reports current alcohol use of about 6.0 standard drinks of alcohol per week. He reports that he does not use drugs.  Results for orders placed or performed in visit on 11/23/21  Comprehensive Metabolic Panel (CMP)  Result Value Ref Range  Glucose 89 70 - 110 mg/dL  Sodium 604 540 - 981 mmol/L  Potassium 3.6 3.6 - 5.1 mmol/L  Chloride 105 97 - 109 mmol/L  Carbon Dioxide (CO2) 28.6 22.0 - 32.0 mmol/L  Urea Nitrogen (BUN) 14 7 - 25 mg/dL  Creatinine 1.3 0.7 - 1.3 mg/dL  Glomerular Filtration Rate (eGFR) 72 >60 mL/min/1.73sq m  Calcium 9.6 8.7 - 10.3 mg/dL  AST 11 8 - 39 U/L  ALT 11 6 - 57 U/L  Alk Phos (alkaline Phosphatase) 108 (H) 34 - 104 U/L  Albumin 4.3 3.5 - 4.8 g/dL  Bilirubin, Total 0.4 0.3 - 1.2 mg/dL  Protein, Total 7.9 6.1 - 7.9 g/dL  A/G Ratio 1.2 1.0 - 5.0 gm/dL  CBC w/auto Differential (5 Part)  Result Value Ref Range  WBC (White Blood Cell Count) 6.6 4.1 - 10.2 10^3/uL  RBC (Red Blood Cell Count) 4.63 (L) 4.69 - 6.13 10^6/uL  Hemoglobin 13.1 (L) 14.1 -  18.1 gm/dL  Hematocrit 19.1 (L) 47.8 - 52.0 %  MCV (Mean Corpuscular Volume) 85.7 80.0 - 100.0 fl  MCH (Mean Corpuscular Hemoglobin) 28.3 27.0 - 31.2 pg  MCHC (Mean Corpuscular Hemoglobin Concentration) 33.0 32.0 - 36.0 gm/dL  Platelet Count 295 621 - 450 10^3/uL  RDW-CV (Red Cell Distribution Width) 13.8 11.6 - 14.8 %  MPV (Mean Platelet Volume) 9.8 9.4 - 12.4 fl  Neutrophils 3.36 1.50 - 7.80 10^3/uL  Lymphocytes 2.63 1.00 - 3.60 10^3/uL  Monocytes 0.38 0.00 - 1.50 10^3/uL  Eosinophils 0.15 0.00 - 0.55 10^3/uL  Basophils 0.06 0.00 - 0.09 10^3/uL  Neutrophil % 50.9 32.0 - 70.0 %  Lymphocyte % 39.8 10.0 - 50.0 %  Monocyte % 5.8 4.0 - 13.0 %  Eosinophil % 2.3 1.0 - 5.0 %  Basophil% 0.9 0.0 - 2.0 %  Immature Granulocyte % 0.3 <=0.7 %  Immature Granulocyte Count 0.02 <=0.06 10^3/L  Lipid Panel w/calc LDL  Result Value Ref Range  Cholesterol, Total 118 100 - 200 mg/dL  Triglyceride 61 35 - 308 mg/dL  HDL (High Density Lipoprotein) Cholesterol 35.5 29.0 - 71.0 mg/dL  LDL Calculated 70 0 - 130 mg/dL  VLDL Cholesterol 12 mg/dL  Cholesterol/HDL Ratio 3.3  PSA, Total (Screen)  Result Value Ref Range  PSA (Prostate Specific Antigen), Total 0.80 0.10 - 4.00 ng/mL  Narrative  Test results were determined with Beckman Coulter Hybritech Assay. Values obtained with different assay methods cannot be used interchangeably in serial testing. Assay results should not be interpreted as absolute evidence of the presence or absence of malignant disease   ROS:  General: No fever, chills or recent illness. No change in weight Skin: No skin lesions, growths, masses, rashes, pruritus  HEENT: No change in vision or hearing. No pain or difficulty with swallowing Respiratory: No cough or shortness of breath CV: No chest pain or palpitations GI: No pain, dyspepsia  or change in bowel habits GU: No dysuria, frequency, or hesitancy MSK: No joint pain or injury Neurological: Positive for left  hemiparesis Objective:   There is no height or weight on file to calculate BMI.  BP (!) 140/100  Pulse 70  SpO2 98%   General: WD/WN male, in no acute distress Neuro: CN grossly intact. Hemiparesis  Assessment/Plan:   Hemiparesis affecting left side as late effect of stroke (CMS/HHS-HCC) (primary encounter diagnosis) Primary hypertension Impaired mobility and ADLs  Assessment and Plan  1. Left hemiparesis. Says he is having more contracture. Will order physical therapy and Occupational Therapy to work with him. 2. Mobility assessment. Patient suffers from severe left hemiparesis of upper and lower extremities. This impairs the ability to perform activities of daily living such as feeding and bathing at home. A walker will not resolve his issue. The patient is unable to propel a standard wheelchair therefore a motorized wheelchair would allow the patient to perform all of his ADLs and be more mobile.   PAIN:  Are you having pain? Yes: NPRS scale: not rated/10 Pain location: L shoulder/ low back Pain description: N/A Aggravating factors: L shoulder PROM Relieving factors: positioning  PRECAUTIONS: Fall  RED FLAGS: None   WEIGHT BEARING RESTRICTIONS: No  FALLS: Has patient fallen in last 6 months? No  LIVING ENVIRONMENT: Lives with: lives with their family Lives in: House/apartment Stairs: No Has following equipment at home: Single point cane, Wheelchair (manual), and Ramped entry  PLOF: Independent before Stroke.  PATIENT GOALS: improve mobility at home/ power w/c for greater independence/ decrease L shoulder/ UE pain  OBJECTIVE:   DIAGNOSTIC FINDINGS: See MD notes  COGNITION: Overall cognitive status: Within functional limits for tasks assessed   SENSATION: Not tested  COORDINATION: NT  EDEMA:  Not measured  MUSCLE TONE: LLE: Moderate L UE: Moderate  POSTURE: rounded shoulders, forward head, increased thoracic kyphosis, and flexed trunk    LOWER/UPPER EXTREMITY ROM:     Supine L shoulder flexion PROM: 125 deg. (Pain), elbow flexion: 153 deg. AAROM, elbow ext. -18 deg. (Pain). L sh. ER 43 deg. AAROM.  Pt. Has L hand splint and CPM to assist with ROM at home.    Supine L hip SLR: 68 deg. (Challenged)- significant quad lag. Supine L ankle DF: -28 deg. (No AROM)- pt. Has AFO but left at home.  L knee flexion 131 deg.   LOWER/UPPER EXTREMITY MMT:    No UE testing   R/L 4/4- Hip flexion 4/4- Hip abduction 4/4- Hip adduction 4/4- Knee extension 4/4- Knee flexion 4/4- Ankle dorsiflexion  BED MOBILITY:  Supine to sit Modified independence  TRANSFERS: Assistive device utilized: Wheelchair (manual)  Sit to stand: Max A Stand to sit: Mod A Chair to chair: Max A Floor: NT  RAMP:  Level of Assistance: NT Assistive device utilized: Wheelchair (manual) Ramp Comments: assist from caregiver  STAIRS: Level of Assistance: Unable to complete stairs  GAIT: Gait pattern: N/A Distance walked: N/A Assistive device utilized: N/A Level of assistance: N/A  FUNCTIONAL TESTS:  5 times sit to stand: TBD  PATIENT SURVEYS:  FOTO initial 34/ goal 48.    TODAY'S TREATMENT:  DATE: 04/20/2023  Subjective: Pt reports doing well today but c/o R knee pain with increase standing/ walking. Pt reports not being able to try walking with hemi-walker at home.     Gait training:      Walking into PT clinic from passenger side of car with use of hemiwalker and CGA from PT (68 feet) with 1 standing rest break.  No LOB but cuing to increase BOS/ consistent step length.    Walking in //-bars with 6 " step overs with use of R UE assist in //-bars: 4  laps,  CGA for safety, stepping over with both LE's - cues for putting weight through L heel when stepping with R LE to avoid clonus  Walking in //-bars with use of 3"  wide plinth to promote consistent wide BOS with step pattern while using R UE on //-bars.  Mirror feedback for posture/ gait pattern, 3 laps - pt demonstrated improved BoS width today  Ambulate outside of building from w/c to car with use of HW and min. A for safety. Pt able to make it all the way to car outside with no rest breaks 70 feet total (no seated rest breaks)- increase L lower leg clonus noted.   Stairs with step to gait use of R handrail/ CGA to min. A for safety.  4 stairs x 1.   There.ex.:    Nustep L4 10 min. B LE/ R UE (moderate cuing to maintain L LE midline position).  Increase L LE clonus today requiring several foot placement changes.    Seated L shoulder AA/PROM (flexion, abduction)- 2x during rest breaks from walking.    STS x5 interspersed throughout session from chair: cueing to try and push from armrests instead of pulling with R UE on // bars  Neuro. Mm.:  Standing at // bars: weight shifting forward and reaching outside BoS to pick up cones from lower surface with R UE x 3 bouts of 6 cones, CGA, Occasional use of R UE on //-bars to prevent posterior LOB.  Functional reaching tasks with R UE.    Lateral weight shifting/ walking in //-bars. 3 laps (increase L lower leg clonus).      PATIENT EDUCATION: Education details: Geologist, engineering use of w/c Person educated: Patient Education method: Medical illustrator Education comprehension: verbalized understanding and returned demonstration  HOME EXERCISE PROGRAM: Will issue HEP next tx. session  GOALS: Goals reviewed with patient? Yes  SHORT TERM GOALS: Target date: 03/26/23  Pt will be independent with HEP in order to improve strength and decrease back pain in order to improve seated tolerance at home. Baseline:  Pt. Limited with ex./ see above for ROM/ MMT deficits Goal status: Partially met   LONG TERM GOALS: Target date: 04/23/23  Pt will increase FOTO score to predicted value of 48 for  improvement in self-reported function with ADL.  Baseline:  initial 34 Goal status: INITIAL  2.  Pt will tolerate 5 mins of standing in // bars with Min A to promote WB and decreased pressure to post pelvic in seated/supine posture  Baseline:  max. Assist in //-bars Goal status: INITIAL  3.  Pt. Will be fitted for power w/c to improve household/ community mobility.   Baseline: limited with manual w/c Goal status: INITIAL  4.  Pt. Able to safely transfer from bed to w/c with mod I safely to decrease need for caregiver assist/ improve independence.  Baseline: max. assist Goal status: INITIAL   ASSESSMENT:  CLINICAL IMPRESSION:  Pt presents to PT with reports of mild L knee and ankle pain. Session today consisted of gait training exercises to work on ambulation with wider BoS, emphasizing heel-strike on LLE, navigating hurdles/obstacles. Static balance exercises implemented as well to work on functional reaching tasks and maintaining balance outside BoS. Sessions going forward will try to emphasize pt ambulating to/from car with hemi-walker in order to improve walking tolerance. Pt will benefit from skilled PT to address deficits and promote safe independence with community and home related mobility and ADLs. Pt would benefit from power w/c referral to improve mobility at home/ community.    OBJECTIVE IMPAIRMENTS: Abnormal gait, decreased activity tolerance, decreased balance, decreased coordination, decreased endurance, decreased knowledge of use of DME, decreased mobility, difficulty walking, decreased ROM, decreased strength, hypomobility, impaired flexibility, improper body mechanics, and pain.   ACTIVITY LIMITATIONS: carrying, lifting, standing, squatting, transfers, bed mobility, bathing, toileting, dressing, and locomotion level  PARTICIPATION LIMITATIONS: meal prep, cleaning, and community activity  PERSONAL FACTORS: Past/current experiences are also affecting patient's functional  outcome.   REHAB POTENTIAL: Good  CLINICAL DECISION MAKING: Evolving/moderate complexity  EVALUATION COMPLEXITY: Moderate  PLAN:  PT FREQUENCY: 2x/week  PT DURATION: 8 weeks  PLANNED INTERVENTIONS: Therapeutic exercises, Therapeutic activity, Neuromuscular re-education, Balance training, Gait training, Patient/Family education, Self Care, Joint mobilization, Stair training, Visual/preceptual remediation/compensation, Orthotic/Fit training, DME instructions, Splintting, Taping, Manual therapy, and Re-evaluation  PLAN FOR NEXT SESSION: Progress standing/walking exercises as tolerated.  Use of Hemiwalker.  10th visit progress note/ recert next tx. Session.    Cammie Mcgee, PT, DPT # [phone] 04/20/23, 12:56 PM

## 2023-04-25 ENCOUNTER — Encounter: Payer: Self-pay | Admitting: Physical Therapy

## 2023-04-25 ENCOUNTER — Ambulatory Visit: Payer: Medicare Other | Admitting: Physical Therapy

## 2023-04-25 DIAGNOSIS — I69354 Hemiplegia and hemiparesis following cerebral infarction affecting left non-dominant side: Secondary | ICD-10-CM

## 2023-04-25 DIAGNOSIS — Z7409 Other reduced mobility: Secondary | ICD-10-CM

## 2023-04-25 DIAGNOSIS — M6281 Muscle weakness (generalized): Secondary | ICD-10-CM

## 2023-04-25 NOTE — Therapy (Unsigned)
OUTPATIENT PHYSICAL THERAPY NEURO TREATMENT/ RECERTIFICATION Physical Therapy Progress Note Dates of reporting period: 02/26/2023 - 04/25/2023  Patient Name: Barry Olson MRN: [id number] DOB:[birth year], 47 y.o., male Today's Date: 04/26/2023   PT End of Session - 04/25/23 1316     Visit Number 10    Number of Visits 22    Date for PT Re-Evaluation 06/06/23    PT Start Time 1300    PT Stop Time 1345    PT Time Calculation (min) 45 min    Equipment Utilized During Treatment Gait belt    Activity Tolerance Patient tolerated treatment well    Behavior During Therapy WFL for tasks assessed/performed               Past Medical History:  Diagnosis Date   Back pain    Past Surgical History:  Procedure Laterality Date   HERNIA REPAIR     There are no problems to display for this patient.   ONSET DATE: 06/06/20  REFERRING DIAG: L hemiparesis/ muscle weakness  THERAPY DIAG:  Hemiplegia and hemiparesis following cerebral infarction affecting left non-dominant side (HCC)  Muscle weakness (generalized)  Spastic hemiplegia of left nondominant side as late effect of cerebral infarction (HCC)  Decreased independence with transfers  Rationale for Evaluation and Treatment: Rehabilitation  SUBJECTIVE:                                                                                                                                                                                             SUBJECTIVE STATEMENT: Pt. Presents to clinic in w/c with history of intracranial hemorrhage with left hemiparesis and dysphagia post stroke.  Pt. Known to PT clinic with tx. In 2022/2023.  Pt. Living with mother and brother and requires max assistance with most daily tasks/ transfers for safely.  Pt. Has a motorized scooter at home and ramp access.  Pt. Using manual tx. With assist with family members.  Pt. Reports L shoulder/ back pain at rest.  Pt accompanied by: family member  PERTINENT  HISTORY:  Subjective:   Barry Olson is a 47 y.o. male in today with issue with his left hand. He has been getting more contractured. He has left-sided hemiparesis from an intracranial bleed years ago.  Current Outpatient Medications  Medication Sig Dispense Refill  amLODIPine (NORVASC) 5 MG tablet Take 1 tablet by mouth once daily 90 tablet 0  baclofen (LIORESAL) 5 mg tablet Take 1 tablet (5 mg total) by mouth nightly 30 tablet 5  gabapentin (NEURONTIN) 300 MG capsule Take 1 capsule by mouth twice daily 60 capsule 2  lisinopriL (ZESTRIL) 40 MG tablet  Take 1 tablet by mouth once daily 90 tablet 0  cyanocobalamin (VITAMIN B12) 1000 MCG tablet Take 1 tablet (1,000 mcg total) by mouth once daily 30 tablet 1   No current facility-administered medications for this visit.   Allergies as of 12/07/2022  (No Known Allergies)   Past Medical History:  Diagnosis Date  Hypertension   No past surgical history on file.  No family history on file.  Social History: reports that he has never smoked. His smokeless tobacco use includes chew. He reports current alcohol use of about 6.0 standard drinks of alcohol per week. He reports that he does not use drugs.  Results for orders placed or performed in visit on 11/23/21  Comprehensive Metabolic Panel (CMP)  Result Value Ref Range  Glucose 89 70 - 110 mg/dL  Sodium 409 811 - 914 mmol/L  Potassium 3.6 3.6 - 5.1 mmol/L  Chloride 105 97 - 109 mmol/L  Carbon Dioxide (CO2) 28.6 22.0 - 32.0 mmol/L  Urea Nitrogen (BUN) 14 7 - 25 mg/dL  Creatinine 1.3 0.7 - 1.3 mg/dL  Glomerular Filtration Rate (eGFR) 72 >60 mL/min/1.73sq m  Calcium 9.6 8.7 - 10.3 mg/dL  AST 11 8 - 39 U/L  ALT 11 6 - 57 U/L  Alk Phos (alkaline Phosphatase) 108 (H) 34 - 104 U/L  Albumin 4.3 3.5 - 4.8 g/dL  Bilirubin, Total 0.4 0.3 - 1.2 mg/dL  Protein, Total 7.9 6.1 - 7.9 g/dL  A/G Ratio 1.2 1.0 - 5.0 gm/dL  CBC w/auto Differential (5 Part)  Result Value Ref Range  WBC (White  Blood Cell Count) 6.6 4.1 - 10.2 10^3/uL  RBC (Red Blood Cell Count) 4.63 (L) 4.69 - 6.13 10^6/uL  Hemoglobin 13.1 (L) 14.1 - 18.1 gm/dL  Hematocrit 78.2 (L) 95.6 - 52.0 %  MCV (Mean Corpuscular Volume) 85.7 80.0 - 100.0 fl  MCH (Mean Corpuscular Hemoglobin) 28.3 27.0 - 31.2 pg  MCHC (Mean Corpuscular Hemoglobin Concentration) 33.0 32.0 - 36.0 gm/dL  Platelet Count 213 086 - 450 10^3/uL  RDW-CV (Red Cell Distribution Width) 13.8 11.6 - 14.8 %  MPV (Mean Platelet Volume) 9.8 9.4 - 12.4 fl  Neutrophils 3.36 1.50 - 7.80 10^3/uL  Lymphocytes 2.63 1.00 - 3.60 10^3/uL  Monocytes 0.38 0.00 - 1.50 10^3/uL  Eosinophils 0.15 0.00 - 0.55 10^3/uL  Basophils 0.06 0.00 - 0.09 10^3/uL  Neutrophil % 50.9 32.0 - 70.0 %  Lymphocyte % 39.8 10.0 - 50.0 %  Monocyte % 5.8 4.0 - 13.0 %  Eosinophil % 2.3 1.0 - 5.0 %  Basophil% 0.9 0.0 - 2.0 %  Immature Granulocyte % 0.3 <=0.7 %  Immature Granulocyte Count 0.02 <=0.06 10^3/L  Lipid Panel w/calc LDL  Result Value Ref Range  Cholesterol, Total 118 100 - 200 mg/dL  Triglyceride 61 35 - 578 mg/dL  HDL (High Density Lipoprotein) Cholesterol 35.5 29.0 - 71.0 mg/dL  LDL Calculated 70 0 - 130 mg/dL  VLDL Cholesterol 12 mg/dL  Cholesterol/HDL Ratio 3.3  PSA, Total (Screen)  Result Value Ref Range  PSA (Prostate Specific Antigen), Total 0.80 0.10 - 4.00 ng/mL  Narrative  Test results were determined with Beckman Coulter Hybritech Assay. Values obtained with different assay methods cannot be used interchangeably in serial testing. Assay results should not be interpreted as absolute evidence of the presence or absence of malignant disease   ROS:  General: No fever, chills or recent illness. No change in weight Skin: No skin lesions, growths, masses, rashes, pruritus  HEENT: No change in vision or  hearing. No pain or difficulty with swallowing Respiratory: No cough or shortness of breath CV: No chest pain or palpitations GI: No pain, dyspepsia or change in  bowel habits GU: No dysuria, frequency, or hesitancy MSK: No joint pain or injury Neurological: Positive for left hemiparesis Objective:   There is no height or weight on file to calculate BMI.  BP (!) 140/100  Pulse 70  SpO2 98%   General: WD/WN male, in no acute distress Neuro: CN grossly intact. Hemiparesis  Assessment/Plan:   Hemiparesis affecting left side as late effect of stroke (CMS/HHS-HCC) (primary encounter diagnosis) Primary hypertension Impaired mobility and ADLs  Assessment and Plan  1. Left hemiparesis. Says he is having more contracture. Will order physical therapy and Occupational Therapy to work with him. 2. Mobility assessment. Patient suffers from severe left hemiparesis of upper and lower extremities. This impairs the ability to perform activities of daily living such as feeding and bathing at home. A walker will not resolve his issue. The patient is unable to propel a standard wheelchair therefore a motorized wheelchair would allow the patient to perform all of his ADLs and be more mobile.   PAIN:  Are you having pain? Yes: NPRS scale: not rated/10 Pain location: L shoulder/ low back Pain description: N/A Aggravating factors: L shoulder PROM Relieving factors: positioning  PRECAUTIONS: Fall  RED FLAGS: None   WEIGHT BEARING RESTRICTIONS: No  FALLS: Has patient fallen in last 6 months? No  LIVING ENVIRONMENT: Lives with: lives with their family Lives in: House/apartment Stairs: No Has following equipment at home: Single point cane, Wheelchair (manual), and Ramped entry  PLOF: Independent before Stroke.  PATIENT GOALS: improve mobility at home/ power w/c for greater independence/ decrease L shoulder/ UE pain  OBJECTIVE:   DIAGNOSTIC FINDINGS: See MD notes  COGNITION: Overall cognitive status: Within functional limits for tasks assessed   SENSATION: Not tested  COORDINATION: NT  EDEMA:  Not measured  MUSCLE TONE: LLE: Moderate L  UE: Moderate  POSTURE: rounded shoulders, forward head, increased thoracic kyphosis, and flexed trunk   LOWER/UPPER EXTREMITY ROM:     Supine L shoulder flexion PROM: 125 deg. (Pain), elbow flexion: 153 deg. AAROM, elbow ext. -18 deg. (Pain). L sh. ER 43 deg. AAROM.  Pt. Has L hand splint and CPM to assist with ROM at home.    Supine L hip SLR: 68 deg. (Challenged)- significant quad lag. Supine L ankle DF: -28 deg. (No AROM)- pt. Has AFO but left at home.  L knee flexion 131 deg.   LOWER/UPPER EXTREMITY MMT:    No UE testing   R/L 4/4- Hip flexion 4/4- Hip abduction 4/4- Hip adduction 4/4- Knee extension 4/4- Knee flexion 4/4- Ankle dorsiflexion  BED MOBILITY:  Supine to sit Modified independence  TRANSFERS: Assistive device utilized: Wheelchair (manual)  Sit to stand: Max A Stand to sit: Mod A Chair to chair: Max A Floor: NT  RAMP:  Level of Assistance: NT Assistive device utilized: Wheelchair (manual) Ramp Comments: assist from caregiver  STAIRS: Level of Assistance: Unable to complete stairs  GAIT: Gait pattern: N/A Distance walked: N/A Assistive device utilized: N/A Level of assistance: N/A  FUNCTIONAL TESTS:  5 times sit to stand: TBD  PATIENT SURVEYS:  FOTO initial 34/ goal 48.    TODAY'S TREATMENT:  DATE: 04/26/2023  Subjective: Pt reports doing well today but c/o R knee pain with increase standing/ walking. Pt reports not being able to try walking with hemi-walker at home.     Gait training:      Walking to/from PT clinic to passenger side of car with use of hemiwalker and CGA from PT (68 feet). No LOB but cueing to increase BOS/ consistent step length.    Walking in //-bars with 6 " step overs with use of R UE assist in //-bars: 4 laps,  CGA for safety, stepping over with both LE's - cues for putting weight through L heel  when stepping with R LE to avoid clonus  Walking in //-bars with use of 3" wide plinth to promote consistent wide BOS with step pattern while using R UE on //-bars.  Mirror feedback for posture/ gait pattern, 4 laps - pt demonstrated improved BoS width today  Alternating 6 inch foot taps: unilateral UE support, CGA; to improve weight shifting to L LE, pt still struggles fully putting weight through heel  There.ex.:    Nustep L4 10 min. B LE/ R UE (moderate cuing to maintain L LE midline position).  Increase L LE clonus today requiring several foot placement changes.    Seated L shoulder AA/PROM (flexion, abduction)- 2x during rest breaks from walking.    STS x5 interspersed throughout session from chair: cueing to try and push from armrests instead of pulling with R UE on // bars  Standing in // bars x 5 minutes for goal reassessment - CGA from PT, single UE support on bar occasionally   Not today: Stairs with step to gait use of R handrail/ CGA to min. A for safety.  4 stairs x 1.  Standing at // bars: weight shifting forward and reaching outside BoS to pick up cones from lower surface with R UE x 3 bouts of 6 cones, CGA, Occasional use of R UE on //-bars to prevent posterior LOB.  Functional reaching tasks with R UE.   Lateral weight shifting/ walking in //-bars. 3 laps (increase L lower leg clonus).     PATIENT EDUCATION: Education details: Geologist, engineering use of w/c Person educated: Patient Education method: Medical illustrator Education comprehension: verbalized understanding and returned demonstration  HOME EXERCISE PROGRAM: Will issue HEP next tx. session  GOALS: Goals reviewed with patient? Yes  SHORT TERM GOALS: Target date: 05/16/23  Pt will be independent with HEP in order to improve strength and decrease back pain in order to improve seated tolerance at home. Baseline:  Pt. Limited with ex./ see above for ROM/ MMT deficits Goal status: Partially  met   LONG TERM GOALS: Target date: 06/06/23  Pt will increase FOTO score to predicted value of 48 for improvement in self-reported function with ADL.  Baseline:  initial 34, 45 (9/18) Goal status: Not met, progressing  2.  Pt will tolerate 5 mins of standing in // bars with Min A to promote WB and decreased pressure to post pelvic in seated/supine posture  Baseline:  max. Assist in //-bars, pt able to stand full 5 mins with min A from PT and unilateral UE support Goal status: GOAL MET (9/18)  3.  Pt. Will be fitted for power w/c to improve household/ community mobility.   Baseline: limited with manual w/c Goal status: INITIAL  4.  Pt. Able to safely transfer from bed to w/c with mod I safely to decrease need for caregiver assist/ improve independence.  Baseline:  max. assist Goal status: INITIAL  5. Pt will be able to ambulate >/= 100 feet consecutively with use of hemiwalker in order to demonstrate improved walking tolerance.  Baseline: ~60-70 feet from car to clinic  Goal status: INITIAL  ASSESSMENT:  CLINICAL IMPRESSION: Pt presents to PT with reports of usual mild L knee pain. Goal reassessment performed today; pt demonstrated improved standing tolerance and was able to stand in // bars with CGA for 5+ minutes with no LoB. New ambulation goal added to pt's plan of care today to try and improve overall walking tolerance/endurance. The rest of the session consisted of gait training and therex to improve pt's walking with wider BoS, weight shifting to the L side, and navigating over obstacles. Pt continues to have difficulty fully weight bearing through his L heel; he demonstrates clonus on the L LE when putting weight through his toes. Pt would benefit from power w/c referral to improve mobility at home/ community.    OBJECTIVE IMPAIRMENTS: Abnormal gait, decreased activity tolerance, decreased balance, decreased coordination, decreased endurance, decreased knowledge of use of DME,  decreased mobility, difficulty walking, decreased ROM, decreased strength, hypomobility, impaired flexibility, improper body mechanics, and pain.   ACTIVITY LIMITATIONS: carrying, lifting, standing, squatting, transfers, bed mobility, bathing, toileting, dressing, and locomotion level  PARTICIPATION LIMITATIONS: meal prep, cleaning, and community activity  PERSONAL FACTORS: Past/current experiences are also affecting patient's functional outcome.   REHAB POTENTIAL: Good  CLINICAL DECISION MAKING: Evolving/moderate complexity  EVALUATION COMPLEXITY: Moderate  PLAN:  PT FREQUENCY: 2x/week  PT DURATION: 6 weeks  PLANNED INTERVENTIONS: Therapeutic exercises, Therapeutic activity, Neuromuscular re-education, Balance training, Gait training, Patient/Family education, Self Care, Joint mobilization, Stair training, Visual/preceptual remediation/compensation, Orthotic/Fit training, DME instructions, Splintting, Taping, Manual therapy, and Re-evaluation  PLAN FOR NEXT SESSION: Progress standing/walking exercises as tolerated.  Use of Hemiwalker.    Cammie Mcgee, PT, DPT # [id number] Cena Benton, SPT 04/26/23, 8:47 AM

## 2023-04-30 ENCOUNTER — Ambulatory Visit: Payer: Medicare Other | Admitting: Physical Therapy

## 2023-04-30 ENCOUNTER — Encounter: Payer: Self-pay | Admitting: Physical Therapy

## 2023-04-30 DIAGNOSIS — M6281 Muscle weakness (generalized): Secondary | ICD-10-CM

## 2023-04-30 DIAGNOSIS — I69354 Hemiplegia and hemiparesis following cerebral infarction affecting left non-dominant side: Secondary | ICD-10-CM

## 2023-04-30 DIAGNOSIS — Z7409 Other reduced mobility: Secondary | ICD-10-CM

## 2023-04-30 NOTE — Therapy (Signed)
OUTPATIENT PHYSICAL THERAPY NEURO TREATMENT  Patient Name: Barry Olson MRN: [id number] DOB:1976/03/02, 47 y.o., male Today's Date: 04/30/2023   PT End of Session - 04/30/23 1131     Visit Number 11    Number of Visits 22    Date for PT Re-Evaluation 06/06/23    PT Start Time 1120    PT Stop Time 1207    PT Time Calculation (min) 47 min    Equipment Utilized During Treatment Gait belt    Activity Tolerance Patient tolerated treatment well    Behavior During Therapy WFL for tasks assessed/performed                Past Medical History:  Diagnosis Date   Back pain    Past Surgical History:  Procedure Laterality Date   HERNIA REPAIR     There are no problems to display for this patient.   ONSET DATE: 06/06/20  REFERRING DIAG: L hemiparesis/ muscle weakness  THERAPY DIAG:  Hemiplegia and hemiparesis following cerebral infarction affecting left non-dominant side (HCC)  Muscle weakness (generalized)  Spastic hemiplegia of left nondominant side as late effect of cerebral infarction (HCC)  Decreased independence with transfers  Rationale for Evaluation and Treatment: Rehabilitation  SUBJECTIVE:                                                                                                                                                                                             SUBJECTIVE STATEMENT: Pt. Presents to clinic in w/c with history of intracranial hemorrhage with left hemiparesis and dysphagia post stroke.  Pt. Known to PT clinic with tx. In 2022/2023.  Pt. Living with mother and brother and requires max assistance with most daily tasks/ transfers for safely.  Pt. Has a motorized scooter at home and ramp access.  Pt. Using manual tx. With assist with family members.  Pt. Reports L shoulder/ back pain at rest.  Pt accompanied by: family member  PERTINENT HISTORY:  Subjective:   Barry Olson is a 47 y.o. male in today with issue with his left  hand. He has been getting more contractured. He has left-sided hemiparesis from an intracranial bleed years ago.  Current Outpatient Medications  Medication Sig Dispense Refill  amLODIPine (NORVASC) 5 MG tablet Take 1 tablet by mouth once daily 90 tablet 0  baclofen (LIORESAL) 5 mg tablet Take 1 tablet (5 mg total) by mouth nightly 30 tablet 5  gabapentin (NEURONTIN) 300 MG capsule Take 1 capsule by mouth twice daily 60 capsule 2  lisinopriL (ZESTRIL) 40 MG tablet Take 1 tablet by mouth once daily 90 tablet 0  cyanocobalamin (VITAMIN B12) 1000 MCG tablet Take 1 tablet (1,000 mcg total) by mouth once daily 30 tablet 1   No current facility-administered medications for this visit.   Allergies as of 12/07/2022  (No Known Allergies)   Past Medical History:  Diagnosis Date  Hypertension   No past surgical history on file.  No family history on file.  Social History: reports that he has never smoked. His smokeless tobacco use includes chew. He reports current alcohol use of about 6.0 standard drinks of alcohol per week. He reports that he does not use drugs.  Results for orders placed or performed in visit on 11/23/21  Comprehensive Metabolic Panel (CMP)  Result Value Ref Range  Glucose 89 70 - 110 mg/dL  Sodium 454 098 - 119 mmol/L  Potassium 3.6 3.6 - 5.1 mmol/L  Chloride 105 97 - 109 mmol/L  Carbon Dioxide (CO2) 28.6 22.0 - 32.0 mmol/L  Urea Nitrogen (BUN) 14 7 - 25 mg/dL  Creatinine 1.3 0.7 - 1.3 mg/dL  Glomerular Filtration Rate (eGFR) 72 >60 mL/min/1.73sq m  Calcium 9.6 8.7 - 10.3 mg/dL  AST 11 8 - 39 U/L  ALT 11 6 - 57 U/L  Alk Phos (alkaline Phosphatase) 108 (H) 34 - 104 U/L  Albumin 4.3 3.5 - 4.8 g/dL  Bilirubin, Total 0.4 0.3 - 1.2 mg/dL  Protein, Total 7.9 6.1 - 7.9 g/dL  A/G Ratio 1.2 1.0 - 5.0 gm/dL  CBC w/auto Differential (5 Part)  Result Value Ref Range  WBC (White Blood Cell Count) 6.6 4.1 - 10.2 10^3/uL  RBC (Red Blood Cell Count) 4.63 (L) 4.69 - 6.13  10^6/uL  Hemoglobin 13.1 (L) 14.1 - 18.1 gm/dL  Hematocrit 14.7 (L) 82.9 - 52.0 %  MCV (Mean Corpuscular Volume) 85.7 80.0 - 100.0 fl  MCH (Mean Corpuscular Hemoglobin) 28.3 27.0 - 31.2 pg  MCHC (Mean Corpuscular Hemoglobin Concentration) 33.0 32.0 - 36.0 gm/dL  Platelet Count 562 130 - 450 10^3/uL  RDW-CV (Red Cell Distribution Width) 13.8 11.6 - 14.8 %  MPV (Mean Platelet Volume) 9.8 9.4 - 12.4 fl  Neutrophils 3.36 1.50 - 7.80 10^3/uL  Lymphocytes 2.63 1.00 - 3.60 10^3/uL  Monocytes 0.38 0.00 - 1.50 10^3/uL  Eosinophils 0.15 0.00 - 0.55 10^3/uL  Basophils 0.06 0.00 - 0.09 10^3/uL  Neutrophil % 50.9 32.0 - 70.0 %  Lymphocyte % 39.8 10.0 - 50.0 %  Monocyte % 5.8 4.0 - 13.0 %  Eosinophil % 2.3 1.0 - 5.0 %  Basophil% 0.9 0.0 - 2.0 %  Immature Granulocyte % 0.3 <=0.7 %  Immature Granulocyte Count 0.02 <=0.06 10^3/L  Lipid Panel w/calc LDL  Result Value Ref Range  Cholesterol, Total 118 100 - 200 mg/dL  Triglyceride 61 35 - 865 mg/dL  HDL (High Density Lipoprotein) Cholesterol 35.5 29.0 - 71.0 mg/dL  LDL Calculated 70 0 - 130 mg/dL  VLDL Cholesterol 12 mg/dL  Cholesterol/HDL Ratio 3.3  PSA, Total (Screen)  Result Value Ref Range  PSA (Prostate Specific Antigen), Total 0.80 0.10 - 4.00 ng/mL  Narrative  Test results were determined with Beckman Coulter Hybritech Assay. Values obtained with different assay methods cannot be used interchangeably in serial testing. Assay results should not be interpreted as absolute evidence of the presence or absence of malignant disease   ROS:  General: No fever, chills or recent illness. No change in weight Skin: No skin lesions, growths, masses, rashes, pruritus  HEENT: No change in vision or hearing. No pain or difficulty with swallowing Respiratory: No cough or  shortness of breath CV: No chest pain or palpitations GI: No pain, dyspepsia or change in bowel habits GU: No dysuria, frequency, or hesitancy MSK: No joint pain or  injury Neurological: Positive for left hemiparesis Objective:   There is no height or weight on file to calculate BMI.  BP (!) 140/100  Pulse 70  SpO2 98%   General: WD/WN male, in no acute distress Neuro: CN grossly intact. Hemiparesis  Assessment/Plan:   Hemiparesis affecting left side as late effect of stroke (CMS/HHS-HCC) (primary encounter diagnosis) Primary hypertension Impaired mobility and ADLs  Assessment and Plan  1. Left hemiparesis. Says he is having more contracture. Will order physical therapy and Occupational Therapy to work with him. 2. Mobility assessment. Patient suffers from severe left hemiparesis of upper and lower extremities. This impairs the ability to perform activities of daily living such as feeding and bathing at home. A walker will not resolve his issue. The patient is unable to propel a standard wheelchair therefore a motorized wheelchair would allow the patient to perform all of his ADLs and be more mobile.   PAIN:  Are you having pain? Yes: NPRS scale: not rated/10 Pain location: L shoulder/ low back Pain description: N/A Aggravating factors: L shoulder PROM Relieving factors: positioning  PRECAUTIONS: Fall  RED FLAGS: None   WEIGHT BEARING RESTRICTIONS: No  FALLS: Has patient fallen in last 6 months? No  LIVING ENVIRONMENT: Lives with: lives with their family Lives in: House/apartment Stairs: No Has following equipment at home: Single point cane, Wheelchair (manual), and Ramped entry  PLOF: Independent before Stroke.  PATIENT GOALS: improve mobility at home/ power w/c for greater independence/ decrease L shoulder/ UE pain  OBJECTIVE:   DIAGNOSTIC FINDINGS: See MD notes  COGNITION: Overall cognitive status: Within functional limits for tasks assessed   SENSATION: Not tested  COORDINATION: NT  EDEMA:  Not measured  MUSCLE TONE: LLE: Moderate L UE: Moderate  POSTURE: rounded shoulders, forward head, increased thoracic  kyphosis, and flexed trunk   LOWER/UPPER EXTREMITY ROM:     Supine L shoulder flexion PROM: 125 deg. (Pain), elbow flexion: 153 deg. AAROM, elbow ext. -18 deg. (Pain). L sh. ER 43 deg. AAROM.  Pt. Has L hand splint and CPM to assist with ROM at home.    Supine L hip SLR: 68 deg. (Challenged)- significant quad lag. Supine L ankle DF: -28 deg. (No AROM)- pt. Has AFO but left at home.  L knee flexion 131 deg.   LOWER/UPPER EXTREMITY MMT:    No UE testing   R/L 4/4- Hip flexion 4/4- Hip abduction 4/4- Hip adduction 4/4- Knee extension 4/4- Knee flexion 4/4- Ankle dorsiflexion  BED MOBILITY:  Supine to sit Modified independence  TRANSFERS: Assistive device utilized: Wheelchair (manual)  Sit to stand: Max A Stand to sit: Mod A Chair to chair: Max A Floor: NT  RAMP:  Level of Assistance: NT Assistive device utilized: Wheelchair (manual) Ramp Comments: assist from caregiver  STAIRS: Level of Assistance: Unable to complete stairs  GAIT: Gait pattern: N/A Distance walked: N/A Assistive device utilized: N/A Level of assistance: N/A  FUNCTIONAL TESTS:  5 times sit to stand: TBD  PATIENT SURVEYS:  FOTO initial 34/ goal 48.    TODAY'S TREATMENT:  DATE: 04/30/2023  Subjective: Pt reports doing well today. Still hasn't been able to try walking with the [facility] at home. Pt reports mild L knee and wrist pain today; his ankle is not bothering him at all.    Gait training:      Walking to/from PT clinic to passenger side of car with use of hemiwalker and CGA from PT x 2 (~ 50-60 feet each time). No LOB but cueing to increase BOS/ consistent step length.    Walking in //-bars with step overs (3 6-inch and 1 12-inch) and 3 inch planks along middle of path for wide BoS: 4 laps,  CGA for safety, stepping over with both LE's - cues for putting weight through L  heel when stepping with R LE to avoid clonus, single UE support on // bars for balance assist   There.ex.:    Nustep L4 5 min. B LE/ R UE (moderate cuing to maintain L LE midline position).  Increase L LE clonus today requiring several foot placement changes.    Seated L shoulder AA/PROM (flexion, abduction)- 2x during rest breaks from walking.    STS x5 interspersed throughout session from chair: cueing to try and push from armrests instead of pulling with R UE on // bars  Neuro Re-ed: Standing at // bars: weight shifting forward and reaching outside BoS to pick up cones from lower surface with R UE x 3 bouts of 10 cones, CGA, Occasional use of R UE on //-bars to prevent posterior LOB.  Functional reaching tasks with R UE.     Not today: Stairs with step to gait use of R handrail/ CGA to min. A for safety.  4 stairs x 1.   Lateral weight shifting/ walking in //-bars. 3 laps (increase L lower leg clonus).    Alternating 6 inch foot taps: unilateral UE support, CGA; to improve weight shifting to L LE, pt still struggles fully putting weight through heel  PATIENT EDUCATION: Education details: Seated posture/ use of w/c Person educated: Patient Education method: Medical illustrator Education comprehension: verbalized understanding and returned demonstration  HOME EXERCISE PROGRAM: Will issue HEP next tx. session  GOALS: Goals reviewed with patient? Yes  SHORT TERM GOALS: Target date: 05/16/23  Pt will be independent with HEP in order to improve strength and decrease back pain in order to improve seated tolerance at home. Baseline:  Pt. Limited with ex./ see above for ROM/ MMT deficits Goal status: Partially met   LONG TERM GOALS: Target date: 06/06/23  Pt will increase FOTO score to predicted value of 48 for improvement in self-reported function with ADL.  Baseline:  initial 34, 45 (9/18) Goal status: Not met, progressing  2.  Pt will tolerate 5 mins of standing in  // bars with Min A to promote WB and decreased pressure to post pelvic in seated/supine posture  Baseline:  max. Assist in //-bars, pt able to stand full 5 mins with min A from PT and unilateral UE support Goal status: GOAL MET (9/18)  3.  Pt. Will be fitted for power w/c to improve household/ community mobility.   Baseline: limited with manual w/c Goal status: INITIAL  4.  Pt. Able to safely transfer from bed to w/c with mod I safely to decrease need for caregiver assist/ improve independence.  Baseline: max. assist Goal status: INITIAL  5. Pt will be able to ambulate >/= 100 feet consecutively with use of hemiwalker in order to demonstrate improved walking tolerance.  Baseline: ~  60-70 feet from car to clinic  Goal status: INITIAL  ASSESSMENT:  CLINICAL IMPRESSION: Pt presents to PT with reports of mild L knee and wrist pain. Session today consisted of gait training in // bars to promote widened BoS and increased step length on the R LE. Gait training exercises combined today to include working on proper weight bearing on the LLE as well as stepping over a hurdle with wide BoS; pt tolerated it well and had no significant losses of balance. Functional reaching exercise performed again to improve pt's ability to lean/reach outside his BoS. Pt will benefit from continued emphasis on increasing overall standing/walking tolerance with hemiwalker for improved home and community mobility. Pt would benefit from power w/c referral to improve mobility at home/ community.    OBJECTIVE IMPAIRMENTS: Abnormal gait, decreased activity tolerance, decreased balance, decreased coordination, decreased endurance, decreased knowledge of use of DME, decreased mobility, difficulty walking, decreased ROM, decreased strength, hypomobility, impaired flexibility, improper body mechanics, and pain.   ACTIVITY LIMITATIONS: carrying, lifting, standing, squatting, transfers, bed mobility, bathing, toileting, dressing, and  locomotion level  PARTICIPATION LIMITATIONS: meal prep, cleaning, and community activity  PERSONAL FACTORS: Past/current experiences are also affecting patient's functional outcome.   REHAB POTENTIAL: Good  CLINICAL DECISION MAKING: Evolving/moderate complexity  EVALUATION COMPLEXITY: Moderate  PLAN:  PT FREQUENCY: 2x/week  PT DURATION: 6 weeks  PLANNED INTERVENTIONS: Therapeutic exercises, Therapeutic activity, Neuromuscular re-education, Balance training, Gait training, Patient/Family education, Self Care, Joint mobilization, Stair training, Visual/preceptual remediation/compensation, Orthotic/Fit training, DME instructions, Splintting, Taping, Manual therapy, and Re-evaluation  PLAN FOR NEXT SESSION: Progress standing/walking exercises as tolerated.  Use of Hemiwalker.  CHECK SCHEDULE   Cammie Mcgee, PT, DPT # [phone] Cena Benton, SPT 04/30/23, 12:22 PM

## 2023-05-02 ENCOUNTER — Ambulatory Visit: Payer: Medicare Other | Admitting: Physical Therapy

## 2023-05-07 ENCOUNTER — Ambulatory Visit: Payer: Medicare Other | Admitting: Physical Therapy

## 2023-05-09 ENCOUNTER — Encounter: Payer: Self-pay | Admitting: Physical Therapy

## 2023-05-09 ENCOUNTER — Ambulatory Visit: Payer: Medicare Other | Attending: Internal Medicine | Admitting: Physical Therapy

## 2023-05-09 DIAGNOSIS — Z7409 Other reduced mobility: Secondary | ICD-10-CM | POA: Diagnosis present

## 2023-05-09 DIAGNOSIS — M6281 Muscle weakness (generalized): Secondary | ICD-10-CM | POA: Diagnosis present

## 2023-05-09 DIAGNOSIS — I69354 Hemiplegia and hemiparesis following cerebral infarction affecting left non-dominant side: Secondary | ICD-10-CM | POA: Diagnosis present

## 2023-05-09 NOTE — Therapy (Signed)
OUTPATIENT PHYSICAL THERAPY NEURO TREATMENT  Patient Name: Barry Olson MRN: [id number] DOB:[birth year], 47 y.o., male Today's Date: 05/09/2023   PT End of Session - 05/09/23 1257     Visit Number 12    Number of Visits 22    Date for PT Re-Evaluation 06/06/23    PT Start Time 1258    PT Stop Time 1350    PT Time Calculation (min) 52 min    Equipment Utilized During Treatment Gait belt    Activity Tolerance Patient tolerated treatment well    Behavior During Therapy WFL for tasks assessed/performed             Past Medical History:  Diagnosis Date   Back pain    Past Surgical History:  Procedure Laterality Date   HERNIA REPAIR     There are no problems to display for this patient.   ONSET DATE: 06/06/20  REFERRING DIAG: L hemiparesis/ muscle weakness  THERAPY DIAG:  Hemiplegia and hemiparesis following cerebral infarction affecting left non-dominant side (HCC)  Muscle weakness (generalized)  Spastic hemiplegia of left nondominant side as late effect of cerebral infarction (HCC)  Decreased independence with transfers  Rationale for Evaluation and Treatment: Rehabilitation  SUBJECTIVE:                                                                                                                                                                                             SUBJECTIVE STATEMENT: Pt. Presents to clinic in w/c with history of intracranial hemorrhage with left hemiparesis and dysphagia post stroke.  Pt. Known to PT clinic with tx. In 2022/2023.  Pt. Living with mother and brother and requires max assistance with most daily tasks/ transfers for safely.  Pt. Has a motorized scooter at home and ramp access.  Pt. Using manual tx. With assist with family members.  Pt. Reports L shoulder/ back pain at rest.  Pt accompanied by: family member  PERTINENT HISTORY:  Subjective:   Barry Olson is a 47 y.o. male in today with issue with his left hand.  He has been getting more contractured. He has left-sided hemiparesis from an intracranial bleed years ago.  Current Outpatient Medications  Medication Sig Dispense Refill  amLODIPine (NORVASC) 5 MG tablet Take 1 tablet by mouth once daily 90 tablet 0  baclofen (LIORESAL) 5 mg tablet Take 1 tablet (5 mg total) by mouth nightly 30 tablet 5  gabapentin (NEURONTIN) 300 MG capsule Take 1 capsule by mouth twice daily 60 capsule 2  lisinopriL (ZESTRIL) 40 MG tablet Take 1 tablet by mouth once daily 90 tablet 0  cyanocobalamin (VITAMIN B12)  1000 MCG tablet Take 1 tablet (1,000 mcg total) by mouth once daily 30 tablet 1   No current facility-administered medications for this visit.   Allergies as of 12/07/2022  (No Known Allergies)   Past Medical History:  Diagnosis Date  Hypertension   No past surgical history on file.  No family history on file.  Social History: reports that he has never smoked. His smokeless tobacco use includes chew. He reports current alcohol use of about 6.0 standard drinks of alcohol per week. He reports that he does not use drugs.  Results for orders placed or performed in visit on 11/23/21  Comprehensive Metabolic Panel (CMP)  Result Value Ref Range  Glucose 89 70 - 110 mg/dL  Sodium 213 086 - 578 mmol/L  Potassium 3.6 3.6 - 5.1 mmol/L  Chloride 105 97 - 109 mmol/L  Carbon Dioxide (CO2) 28.6 22.0 - 32.0 mmol/L  Urea Nitrogen (BUN) 14 7 - 25 mg/dL  Creatinine 1.3 0.7 - 1.3 mg/dL  Glomerular Filtration Rate (eGFR) 72 >60 mL/min/1.73sq m  Calcium 9.6 8.7 - 10.3 mg/dL  AST 11 8 - 39 U/L  ALT 11 6 - 57 U/L  Alk Phos (alkaline Phosphatase) 108 (H) 34 - 104 U/L  Albumin 4.3 3.5 - 4.8 g/dL  Bilirubin, Total 0.4 0.3 - 1.2 mg/dL  Protein, Total 7.9 6.1 - 7.9 g/dL  A/G Ratio 1.2 1.0 - 5.0 gm/dL  CBC w/auto Differential (5 Part)  Result Value Ref Range  WBC (White Blood Cell Count) 6.6 4.1 - 10.2 10^3/uL  RBC (Red Blood Cell Count) 4.63 (L) 4.69 - 6.13 10^6/uL   Hemoglobin 13.1 (L) 14.1 - 18.1 gm/dL  Hematocrit 46.9 (L) 62.9 - 52.0 %  MCV (Mean Corpuscular Volume) 85.7 80.0 - 100.0 fl  MCH (Mean Corpuscular Hemoglobin) 28.3 27.0 - 31.2 pg  MCHC (Mean Corpuscular Hemoglobin Concentration) 33.0 32.0 - 36.0 gm/dL  Platelet Count 528 413 - 450 10^3/uL  RDW-CV (Red Cell Distribution Width) 13.8 11.6 - 14.8 %  MPV (Mean Platelet Volume) 9.8 9.4 - 12.4 fl  Neutrophils 3.36 1.50 - 7.80 10^3/uL  Lymphocytes 2.63 1.00 - 3.60 10^3/uL  Monocytes 0.38 0.00 - 1.50 10^3/uL  Eosinophils 0.15 0.00 - 0.55 10^3/uL  Basophils 0.06 0.00 - 0.09 10^3/uL  Neutrophil % 50.9 32.0 - 70.0 %  Lymphocyte % 39.8 10.0 - 50.0 %  Monocyte % 5.8 4.0 - 13.0 %  Eosinophil % 2.3 1.0 - 5.0 %  Basophil% 0.9 0.0 - 2.0 %  Immature Granulocyte % 0.3 <=0.7 %  Immature Granulocyte Count 0.02 <=0.06 10^3/L  Lipid Panel w/calc LDL  Result Value Ref Range  Cholesterol, Total 118 100 - 200 mg/dL  Triglyceride 61 35 - 244 mg/dL  HDL (High Density Lipoprotein) Cholesterol 35.5 29.0 - 71.0 mg/dL  LDL Calculated 70 0 - 130 mg/dL  VLDL Cholesterol 12 mg/dL  Cholesterol/HDL Ratio 3.3  PSA, Total (Screen)  Result Value Ref Range  PSA (Prostate Specific Antigen), Total 0.80 0.10 - 4.00 ng/mL  Narrative  Test results were determined with Beckman Coulter Hybritech Assay. Values obtained with different assay methods cannot be used interchangeably in serial testing. Assay results should not be interpreted as absolute evidence of the presence or absence of malignant disease   ROS:  General: No fever, chills or recent illness. No change in weight Skin: No skin lesions, growths, masses, rashes, pruritus  HEENT: No change in vision or hearing. No pain or difficulty with swallowing Respiratory: No cough or shortness of breath  CV: No chest pain or palpitations GI: No pain, dyspepsia or change in bowel habits GU: No dysuria, frequency, or hesitancy MSK: No joint pain or injury Neurological:  Positive for left hemiparesis Objective:   There is no height or weight on file to calculate BMI.  BP (!) 140/100  Pulse 70  SpO2 98%   General: WD/WN male, in no acute distress Neuro: CN grossly intact. Hemiparesis  Assessment/Plan:   Hemiparesis affecting left side as late effect of stroke (CMS/HHS-HCC) (primary encounter diagnosis) Primary hypertension Impaired mobility and ADLs  Assessment and Plan  1. Left hemiparesis. Says he is having more contracture. Will order physical therapy and Occupational Therapy to work with him. 2. Mobility assessment. Patient suffers from severe left hemiparesis of upper and lower extremities. This impairs the ability to perform activities of daily living such as feeding and bathing at home. A walker will not resolve his issue. The patient is unable to propel a standard wheelchair therefore a motorized wheelchair would allow the patient to perform all of his ADLs and be more mobile.   PAIN:  Are you having pain? Yes: NPRS scale: not rated/10 Pain location: L shoulder/ low back Pain description: N/A Aggravating factors: L shoulder PROM Relieving factors: positioning  PRECAUTIONS: Fall  RED FLAGS: None   WEIGHT BEARING RESTRICTIONS: No  FALLS: Has patient fallen in last 6 months? No  LIVING ENVIRONMENT: Lives with: lives with their family Lives in: House/apartment Stairs: No Has following equipment at home: Single point cane, Wheelchair (manual), and Ramped entry  PLOF: Independent before Stroke.  PATIENT GOALS: improve mobility at home/ power w/c for greater independence/ decrease L shoulder/ UE pain  OBJECTIVE:   DIAGNOSTIC FINDINGS: See MD notes  COGNITION: Overall cognitive status: Within functional limits for tasks assessed   SENSATION: Not tested  COORDINATION: NT  EDEMA:  Not measured  MUSCLE TONE: LLE: Moderate L UE: Moderate  POSTURE: rounded shoulders, forward head, increased thoracic kyphosis, and flexed  trunk   LOWER/UPPER EXTREMITY ROM:     Supine L shoulder flexion PROM: 125 deg. (Pain), elbow flexion: 153 deg. AAROM, elbow ext. -18 deg. (Pain). L sh. ER 43 deg. AAROM.  Pt. Has L hand splint and CPM to assist with ROM at home.    Supine L hip SLR: 68 deg. (Challenged)- significant quad lag. Supine L ankle DF: -28 deg. (No AROM)- pt. Has AFO but left at home.  L knee flexion 131 deg.   LOWER/UPPER EXTREMITY MMT:    No UE testing   R/L 4/4- Hip flexion 4/4- Hip abduction 4/4- Hip adduction 4/4- Knee extension 4/4- Knee flexion 4/4- Ankle dorsiflexion  BED MOBILITY:  Supine to sit Modified independence  TRANSFERS: Assistive device utilized: Wheelchair (manual)  Sit to stand: Max A Stand to sit: Mod A Chair to chair: Max A Floor: NT  RAMP:  Level of Assistance: NT Assistive device utilized: Wheelchair (manual) Ramp Comments: assist from caregiver  STAIRS: Level of Assistance: Unable to complete stairs  GAIT: Gait pattern: N/A Distance walked: N/A Assistive device utilized: N/A Level of assistance: N/A  FUNCTIONAL TESTS:  5 times sit to stand: TBD  PATIENT SURVEYS:  FOTO initial 34/ goal 48.    TODAY'S TREATMENT:  DATE: 05/09/2023  Subjective: Pt reports doing well today. He says his L shoulder is bothering him a little today, he thinks he may have slept on it wrong. Pt reports being able to do some walking at home over the weekend and it went well.  Gait training:      Walking to/from PT clinic to passenger side of car with use of hemiwalker and CGA from PT x 2 (~ 50-60 feet each time). No LOB but cueing to increase BOS/ consistent step length.    Ambulation in // bars with Hemiwalker: 2 laps, CGA for safety  Walking in //-bars with 3 inch planks along middle of path for wide BoS: 4 laps,  CGA for safety, using hemi-walker- cues for  putting weight through L heel when stepping with R LE to avoid clonus,  There.ex.:    Nustep L4 10 min. B LE/ R UE (moderate cuing to maintain L LE midline position).  Seated L shoulder AA/PROM (flexion, abduction)- 2x during rest breaks from walking.    STS x5 interspersed throughout session from chair: cueing to try and push from armrests instead of pulling with R UE on // bars  HEP given to pt at end of session  - Sit-to-stands  - Resisted hip abduction  - Resisted seated marches  - Static standing balance   Not today: Stairs with step to gait use of R handrail/ CGA to min. A for safety.  4 stairs x 1.   Lateral weight shifting/ walking in //-bars. 3 laps (increase L lower leg clonus).    Alternating 6 inch foot taps: unilateral UE support, CGA; to improve weight shifting to L LE, pt still struggles fully putting weight through heel  Standing at // bars: weight shifting forward and reaching outside BoS to pick up cones from lower surface with R UE x 3 bouts of 10 cones, CGA, Occasional use of R UE on //-bars to prevent posterior LOB.  Functional reaching tasks with R UE.    PATIENT EDUCATION: Education details: Geologist, engineering use of w/c Person educated: Patient Education method: Medical illustrator Education comprehension: verbalized understanding and returned demonstration  HOME EXERCISE PROGRAM: Access Code: [email or web address] URL: [email or web address] Date: 05/09/2023 Prepared by: Dorene Grebe Exercises - Sit to Stand with Counter Support - 2 x daily - 4-5 x weekly - 2 sets - 10 reps - Seated Hip Abduction with Resistance - 2 x daily - 4-5 x weekly - 2 sets - 10 reps - Seated March with Resistance - 2 x daily - 4-5 x weekly - 2 sets - 10 reps - Standing Balance with Eyes Open - 2 x daily - 7 x weekly - 2 sets - 10 reps   GOALS: Goals reviewed with patient? Yes  SHORT TERM GOALS: Target date: 05/16/23  Pt will be independent with HEP in order to improve  strength and decrease back pain in order to improve seated tolerance at home. Baseline:  Pt. Limited with ex./ see above for ROM/ MMT deficits Goal status: Partially met   LONG TERM GOALS: Target date: 06/06/23  Pt will increase FOTO score to predicted value of 48 for improvement in self-reported function with ADL.  Baseline:  initial 34, 45 (9/18) Goal status: Not met, progressing  2.  Pt will tolerate 5 mins of standing in // bars with Min A to promote WB and decreased pressure to post pelvic in seated/supine posture  Baseline:  max. Assist in //-bars, pt able to stand full 5 mins  with min A from PT and unilateral UE support Goal status: GOAL MET (9/18)  3.  Pt. Will be fitted for power w/c to improve household/ community mobility.   Baseline: limited with manual w/c Goal status: INITIAL  4.  Pt. Able to safely transfer from bed to w/c with mod I safely to decrease need for caregiver assist/ improve independence.  Baseline: max. assist Goal status: INITIAL  5. Pt will be able to ambulate >/= 100 feet consecutively with use of hemiwalker in order to demonstrate improved walking tolerance.  Baseline: ~60-70 feet from car to clinic  Goal status: INITIAL  ASSESSMENT:  CLINICAL IMPRESSION: Pt presents to PT with reports of mild L shoulder pain. Today's session consisted of gait training to improve pt's ability to properly bear weight through his L heel in order to decrease clonus. Gait training also emphasized walking with widened BoS and with hemiwalker. Pt educated on and given new HEP at end of session consisting of seated therex that pt can perform independently at home, as well as static standing exercises to improve standing tolerance and balance. Pt educated to continue trying to walk with hemiwalker at home as much as possible to improve walking endurance. Pt will benefit from continued emphasis on increasing overall standing/walking tolerance with hemiwalker for improved home and  community mobility. Pt would benefit from power w/c referral to improve mobility at home/ community.    OBJECTIVE IMPAIRMENTS: Abnormal gait, decreased activity tolerance, decreased balance, decreased coordination, decreased endurance, decreased knowledge of use of DME, decreased mobility, difficulty walking, decreased ROM, decreased strength, hypomobility, impaired flexibility, improper body mechanics, and pain.   ACTIVITY LIMITATIONS: carrying, lifting, standing, squatting, transfers, bed mobility, bathing, toileting, dressing, and locomotion level  PARTICIPATION LIMITATIONS: meal prep, cleaning, and community activity  PERSONAL FACTORS: Past/current experiences are also affecting patient's functional outcome.   REHAB POTENTIAL: Good  CLINICAL DECISION MAKING: Evolving/moderate complexity  EVALUATION COMPLEXITY: Moderate  PLAN:  PT FREQUENCY: 2x/week  PT DURATION: 6 weeks  PLANNED INTERVENTIONS: Therapeutic exercises, Therapeutic activity, Neuromuscular re-education, Balance training, Gait training, Patient/Family education, Self Care, Joint mobilization, Stair training, Visual/preceptual remediation/compensation, Orthotic/Fit training, DME instructions, Splintting, Taping, Manual therapy, and Re-evaluation  PLAN FOR NEXT SESSION: Progress standing/walking exercises as tolerated.  Use of Hemiwalker.  Review new HEP  Cammie Mcgee, PT, DPT # [phone] Cena Benton, SPT 05/09/23, 3:34 PM

## 2023-05-16 ENCOUNTER — Ambulatory Visit: Payer: Medicare Other | Admitting: Physical Therapy

## 2023-05-23 ENCOUNTER — Ambulatory Visit: Payer: Medicare Other | Admitting: Physical Therapy

## 2023-05-30 ENCOUNTER — Ambulatory Visit: Payer: Medicare Other | Admitting: Physical Therapy

## 2023-06-06 ENCOUNTER — Ambulatory Visit: Payer: Medicare Other

## 2023-06-06 ENCOUNTER — Encounter: Payer: Self-pay | Admitting: Physical Therapy

## 2023-06-06 DIAGNOSIS — I69354 Hemiplegia and hemiparesis following cerebral infarction affecting left non-dominant side: Secondary | ICD-10-CM | POA: Diagnosis not present

## 2023-06-06 DIAGNOSIS — Z7409 Other reduced mobility: Secondary | ICD-10-CM

## 2023-06-06 DIAGNOSIS — M6281 Muscle weakness (generalized): Secondary | ICD-10-CM

## 2023-06-06 NOTE — Therapy (Signed)
OUTPATIENT PHYSICAL THERAPY NEURO TREATMENT/RECERTIFICATION  Patient Name: Barry Olson MRN: [id number] DOB:[birth year], 47 y.o., male Today's Date: 06/06/2023   PT End of Session - 06/06/23 1256     Visit Number 13    Number of Visits 26    Date for PT Re-Evaluation 07/04/23    PT Start Time 1259    PT Stop Time 1346    PT Time Calculation (min) 47 min    Equipment Utilized During Treatment Gait belt    Activity Tolerance Patient tolerated treatment well    Behavior During Therapy WFL for tasks assessed/performed              Past Medical History:  Diagnosis Date   Back pain    Past Surgical History:  Procedure Laterality Date   HERNIA REPAIR     There are no problems to display for this patient.   ONSET DATE: 06/06/20  REFERRING DIAG: L hemiparesis/ muscle weakness  THERAPY DIAG:  Hemiplegia and hemiparesis following cerebral infarction affecting left non-dominant side (HCC)  Muscle weakness (generalized)  Spastic hemiplegia of left nondominant side as late effect of cerebral infarction (HCC)  Decreased independence with transfers  Rationale for Evaluation and Treatment: Rehabilitation  SUBJECTIVE:                                                                                                                                                                                             SUBJECTIVE STATEMENT: Pt. Presents to clinic in w/c with history of intracranial hemorrhage with left hemiparesis and dysphagia post stroke.  Pt. Known to PT clinic with tx. In 2022/2023.  Pt. Living with mother and brother and requires max assistance with most daily tasks/ transfers for safely.  Pt. Has a motorized scooter at home and ramp access.  Pt. Using manual tx. With assist with family members.  Pt. Reports L shoulder/ back pain at rest.  Pt accompanied by: family member  PERTINENT HISTORY:  Subjective:   Leonard Fagnani is a 47 y.o. male in today with issue  with his left hand. He has been getting more contractured. He has left-sided hemiparesis from an intracranial bleed years ago.  Current Outpatient Medications  Medication Sig Dispense Refill  amLODIPine (NORVASC) 5 MG tablet Take 1 tablet by mouth once daily 90 tablet 0  baclofen (LIORESAL) 5 mg tablet Take 1 tablet (5 mg total) by mouth nightly 30 tablet 5  gabapentin (NEURONTIN) 300 MG capsule Take 1 capsule by mouth twice daily 60 capsule 2  lisinopriL (ZESTRIL) 40 MG tablet Take 1 tablet by mouth once daily 90 tablet 0  cyanocobalamin (VITAMIN  B12) 1000 MCG tablet Take 1 tablet (1,000 mcg total) by mouth once daily 30 tablet 1   No current facility-administered medications for this visit.   Allergies as of 12/07/2022  (No Known Allergies)   Past Medical History:  Diagnosis Date  Hypertension   No past surgical history on file.  No family history on file.  Social History: reports that he has never smoked. His smokeless tobacco use includes chew. He reports current alcohol use of about 6.0 standard drinks of alcohol per week. He reports that he does not use drugs.  Results for orders placed or performed in visit on 11/23/21  Comprehensive Metabolic Panel (CMP)  Result Value Ref Range  Glucose 89 70 - 110 mg/dL  Sodium 161 096 - 045 mmol/L  Potassium 3.6 3.6 - 5.1 mmol/L  Chloride 105 97 - 109 mmol/L  Carbon Dioxide (CO2) 28.6 22.0 - 32.0 mmol/L  Urea Nitrogen (BUN) 14 7 - 25 mg/dL  Creatinine 1.3 0.7 - 1.3 mg/dL  Glomerular Filtration Rate (eGFR) 72 >60 mL/min/1.73sq m  Calcium 9.6 8.7 - 10.3 mg/dL  AST 11 8 - 39 U/L  ALT 11 6 - 57 U/L  Alk Phos (alkaline Phosphatase) 108 (H) 34 - 104 U/L  Albumin 4.3 3.5 - 4.8 g/dL  Bilirubin, Total 0.4 0.3 - 1.2 mg/dL  Protein, Total 7.9 6.1 - 7.9 g/dL  A/G Ratio 1.2 1.0 - 5.0 gm/dL  CBC w/auto Differential (5 Part)  Result Value Ref Range  WBC (White Blood Cell Count) 6.6 4.1 - 10.2 10^3/uL  RBC (Red Blood Cell Count) 4.63 (L)  4.69 - 6.13 10^6/uL  Hemoglobin 13.1 (L) 14.1 - 18.1 gm/dL  Hematocrit 40.9 (L) 81.1 - 52.0 %  MCV (Mean Corpuscular Volume) 85.7 80.0 - 100.0 fl  MCH (Mean Corpuscular Hemoglobin) 28.3 27.0 - 31.2 pg  MCHC (Mean Corpuscular Hemoglobin Concentration) 33.0 32.0 - 36.0 gm/dL  Platelet Count 914 782 - 450 10^3/uL  RDW-CV (Red Cell Distribution Width) 13.8 11.6 - 14.8 %  MPV (Mean Platelet Volume) 9.8 9.4 - 12.4 fl  Neutrophils 3.36 1.50 - 7.80 10^3/uL  Lymphocytes 2.63 1.00 - 3.60 10^3/uL  Monocytes 0.38 0.00 - 1.50 10^3/uL  Eosinophils 0.15 0.00 - 0.55 10^3/uL  Basophils 0.06 0.00 - 0.09 10^3/uL  Neutrophil % 50.9 32.0 - 70.0 %  Lymphocyte % 39.8 10.0 - 50.0 %  Monocyte % 5.8 4.0 - 13.0 %  Eosinophil % 2.3 1.0 - 5.0 %  Basophil% 0.9 0.0 - 2.0 %  Immature Granulocyte % 0.3 <=0.7 %  Immature Granulocyte Count 0.02 <=0.06 10^3/L  Lipid Panel w/calc LDL  Result Value Ref Range  Cholesterol, Total 118 100 - 200 mg/dL  Triglyceride 61 35 - 956 mg/dL  HDL (High Density Lipoprotein) Cholesterol 35.5 29.0 - 71.0 mg/dL  LDL Calculated 70 0 - 130 mg/dL  VLDL Cholesterol 12 mg/dL  Cholesterol/HDL Ratio 3.3  PSA, Total (Screen)  Result Value Ref Range  PSA (Prostate Specific Antigen), Total 0.80 0.10 - 4.00 ng/mL  Narrative  Test results were determined with Beckman Coulter Hybritech Assay. Values obtained with different assay methods cannot be used interchangeably in serial testing. Assay results should not be interpreted as absolute evidence of the presence or absence of malignant disease   ROS:  General: No fever, chills or recent illness. No change in weight Skin: No skin lesions, growths, masses, rashes, pruritus  HEENT: No change in vision or hearing. No pain or difficulty with swallowing Respiratory: No cough or shortness of  breath CV: No chest pain or palpitations GI: No pain, dyspepsia or change in bowel habits GU: No dysuria, frequency, or hesitancy MSK: No joint pain or  injury Neurological: Positive for left hemiparesis Objective:   There is no height or weight on file to calculate BMI.  BP (!) 140/100  Pulse 70  SpO2 98%   General: WD/WN male, in no acute distress Neuro: CN grossly intact. Hemiparesis  Assessment/Plan:   Hemiparesis affecting left side as late effect of stroke (CMS/HHS-HCC) (primary encounter diagnosis) Primary hypertension Impaired mobility and ADLs  Assessment and Plan  1. Left hemiparesis. Says he is having more contracture. Will order physical therapy and Occupational Therapy to work with him. 2. Mobility assessment. Patient suffers from severe left hemiparesis of upper and lower extremities. This impairs the ability to perform activities of daily living such as feeding and bathing at home. A walker will not resolve his issue. The patient is unable to propel a standard wheelchair therefore a motorized wheelchair would allow the patient to perform all of his ADLs and be more mobile.   PAIN:  Are you having pain? Yes: NPRS scale: not rated/10 Pain location: L shoulder/ low back Pain description: N/A Aggravating factors: L shoulder PROM Relieving factors: positioning  PRECAUTIONS: Fall  RED FLAGS: None   WEIGHT BEARING RESTRICTIONS: No  FALLS: Has patient fallen in last 6 months? No  LIVING ENVIRONMENT: Lives with: lives with their family Lives in: House/apartment Stairs: No Has following equipment at home: Single point cane, Wheelchair (manual), and Ramped entry  PLOF: Independent before Stroke.  PATIENT GOALS: improve mobility at home/ power w/c for greater independence/ decrease L shoulder/ UE pain  OBJECTIVE:   DIAGNOSTIC FINDINGS: See MD notes  COGNITION: Overall cognitive status: Within functional limits for tasks assessed   SENSATION: Not tested  COORDINATION: NT  EDEMA:  Not measured  MUSCLE TONE: LLE: Moderate L UE: Moderate  POSTURE: rounded shoulders, forward head, increased thoracic  kyphosis, and flexed trunk   LOWER/UPPER EXTREMITY ROM:     Supine L shoulder flexion PROM: 125 deg. (Pain), elbow flexion: 153 deg. AAROM, elbow ext. -18 deg. (Pain). L sh. ER 43 deg. AAROM.  Pt. Has L hand splint and CPM to assist with ROM at home.    Supine L hip SLR: 68 deg. (Challenged)- significant quad lag. Supine L ankle DF: -28 deg. (No AROM)- pt. Has AFO but left at home.  L knee flexion 131 deg.   LOWER/UPPER EXTREMITY MMT:    No UE testing   R/L 4/4- Hip flexion 4/4- Hip abduction 4/4- Hip adduction 4/4- Knee extension 4/4- Knee flexion 4/4- Ankle dorsiflexion  BED MOBILITY:  Supine to sit Modified independence  TRANSFERS: Assistive device utilized: Wheelchair (manual)  Sit to stand: Max A Stand to sit: Mod A Chair to chair: Max A Floor: NT  RAMP:  Level of Assistance: NT Assistive device utilized: Wheelchair (manual) Ramp Comments: assist from caregiver  STAIRS: Level of Assistance: Unable to complete stairs  GAIT: Gait pattern: N/A Distance walked: N/A Assistive device utilized: N/A Level of assistance: N/A  FUNCTIONAL TESTS:  5 times sit to stand: TBD  PATIENT SURVEYS:  FOTO initial 34/ goal 48.    TODAY'S TREATMENT:  DATE: 06/06/2023  Subjective: Pt reports doing well today. Pt reports he has not been able to do any walking at home since he was here last. No pain in his legs or L shoulder today right now.  Gait training:    Ambulation with HW in hallway: 3x20 feet with CGA for safety - pt limited in all his walks today secondary to increased R knee pain with prolonged weightbearing  Walking in //-bars with 3 inch planks along middle of path for wide BoS: 4 laps,  CGA for safety, using hemi-walker- cues for putting weight through L heel when stepping with R LE to avoid clonus  There.ex.:   FOTO: 51  Nustep L4 10  min. B LE/ R UE (moderate cuing to maintain L LE midline position).  Seated L shoulder AA/PROM (flexion, abduction)- 2x during rest breaks from walking.    STS x5 interspersed throughout session from chair: cueing to try and push from armrests instead of pulling with R UE on // bars    Not today: Stairs with step to gait use of R handrail/ CGA to min. A for safety.  4 stairs x 1.  Lateral weight shifting/ walking in //-bars. 3 laps (increase L lower leg clonus).    Alternating 6 inch foot taps: unilateral UE support, CGA; to improve weight shifting to L LE, pt still struggles fully putting weight through heel Standing at // bars: weight shifting forward and reaching outside BoS to pick up cones from lower surface with R UE x 3 bouts of 10 cones, CGA, Occasional use of R UE on //-bars to prevent posterior LOB.  Functional reaching tasks with R UE.    PATIENT EDUCATION: Education details: Geologist, engineering use of w/c Person educated: Patient Education method: Medical illustrator Education comprehension: verbalized understanding and returned demonstration  HOME EXERCISE PROGRAM: Access Code: [email or web address] URL: [email or web address] Date: 05/09/2023 Prepared by: Dorene Grebe Exercises - Sit to Stand with Counter Support - 2 x daily - 4-5 x weekly - 2 sets - 10 reps - Seated Hip Abduction with Resistance - 2 x daily - 4-5 x weekly - 2 sets - 10 reps - Seated March with Resistance - 2 x daily - 4-5 x weekly - 2 sets - 10 reps - Standing Balance with Eyes Open - 2 x daily - 7 x weekly - 2 sets - 10 reps   GOALS: Goals reviewed with patient? Yes  SHORT TERM GOALS: Target date: 05/16/23  Pt will be independent with HEP in order to improve strength and decrease back pain in order to improve seated tolerance at home. Baseline:  Pt. Limited with ex./ see above for ROM/ MMT deficits Goal status: Partially met   LONG TERM GOALS: Target date: 06/06/23  Pt will increase FOTO  score to predicted value of 48 for improvement in self-reported function with ADL.  Baseline:  initial 34, 45 (9/18), 10/30: 51 Goal status: GOAL MET (10/30)  2.  Pt will tolerate 5 mins of standing in // bars with Min A to promote WB and decreased pressure to post pelvic in seated/supine posture  Baseline:  max. Assist in //-bars, pt able to stand full 5 mins with min A from PT and unilateral UE support Goal status: GOAL MET (9/18)  3.  Pt. Will be fitted for power w/c to improve household/ community mobility.   Baseline: limited with manual w/c Goal status: INITIAL  4.  Pt. Able to safely transfer from bed to w/c with mod I  safely to decrease need for caregiver assist/ improve independence.  Baseline: max. Assist, 10/30: pt reports being able to get out of bed independently Goal status: Not met, progressing  5. Pt will be able to ambulate >/= 100 feet consecutively with use of hemiwalker in order to demonstrate improved walking tolerance.  Baseline: ~60-70 feet from car to clinic, 10/30: pt able to walk about 20 feet before needing to sit due to R knee pain  Goal status: Not met, progressing  ASSESSMENT:  CLINICAL IMPRESSION: Pt presents to PT with reports of no pain. Reassessment of pt's goals and function performed today due to pt missing the last several weeks. Pt continues to do well with standing endurance/tolerance and does not require any PT support when maintaining his standing balance within the // bars. Pt's ambulation distance was limited today secondary to increased R knee pain with prolonged weightbearing. Pt's FOTO score demonstrated improvement since the last progress note. The rest of the session consisted of gait training with emphasis on widening the pt's BoS throughout his walk, as well as weightbearing through his L heel to avoid onset of clonus. Pt educated on importance of increasing consistency with performing his HEP in order to improve carryover for walking  tolerance. Pt will benefit from continued emphasis on increasing overall standing/walking tolerance with hemiwalker for improved home and community mobility. Pt would benefit from power w/c referral to improve mobility at home/ community.    OBJECTIVE IMPAIRMENTS: Abnormal gait, decreased activity tolerance, decreased balance, decreased coordination, decreased endurance, decreased knowledge of use of DME, decreased mobility, difficulty walking, decreased ROM, decreased strength, hypomobility, impaired flexibility, improper body mechanics, and pain.   ACTIVITY LIMITATIONS: carrying, lifting, standing, squatting, transfers, bed mobility, bathing, toileting, dressing, and locomotion level  PARTICIPATION LIMITATIONS: meal prep, cleaning, and community activity  PERSONAL FACTORS: Past/current experiences are also affecting patient's functional outcome.   REHAB POTENTIAL: Good  CLINICAL DECISION MAKING: Evolving/moderate complexity  EVALUATION COMPLEXITY: Moderate  PLAN:  PT FREQUENCY: 2x/week  PT DURATION: 6 weeks  PLANNED INTERVENTIONS: Therapeutic exercises, Therapeutic activity, Neuromuscular re-education, Balance training, Gait training, Patient/Family education, Self Care, Joint mobilization, Stair training, Visual/preceptual remediation/compensation, Orthotic/Fit training, DME instructions, Splintting, Taping, Manual therapy, and Re-evaluation  PLAN FOR NEXT SESSION: Progress standing/walking exercises as tolerated.  Use of Hemiwalker.  Review new HEP  Nyia Tsao, SPT  Maylon Peppers, PT, DPT Physical Therapist - [facility]  06/06/23, 2:48 PM

## 2023-06-15 ENCOUNTER — Ambulatory Visit: Payer: Medicare Other | Attending: Internal Medicine | Admitting: Physical Therapy

## 2023-06-15 DIAGNOSIS — M6281 Muscle weakness (generalized): Secondary | ICD-10-CM | POA: Insufficient documentation

## 2023-06-15 DIAGNOSIS — I69354 Hemiplegia and hemiparesis following cerebral infarction affecting left non-dominant side: Secondary | ICD-10-CM | POA: Insufficient documentation

## 2023-06-15 DIAGNOSIS — Z7409 Other reduced mobility: Secondary | ICD-10-CM | POA: Diagnosis present

## 2023-06-18 ENCOUNTER — Encounter: Payer: Self-pay | Admitting: Physical Therapy

## 2023-06-18 NOTE — Therapy (Signed)
OUTPATIENT PHYSICAL THERAPY NEURO TREATMENT  Patient Name: Barry Olson MRN: [id number] DOB:08-07-1976, 47 y.o., male Today's Date: 06/15/23   PT End of Session - 06/18/23 1144     Visit Number 14    Number of Visits 26    Date for PT Re-Evaluation 07/04/23    PT Start Time 0941    PT Stop Time 1040    PT Time Calculation (min) 59 min    Equipment Utilized During Treatment Gait belt    Activity Tolerance Patient tolerated treatment well    Behavior During Therapy WFL for tasks assessed/performed              Past Medical History:  Diagnosis Date   Back pain    Past Surgical History:  Procedure Laterality Date   HERNIA REPAIR     There are no problems to display for this patient.   ONSET DATE: 06/06/20  REFERRING DIAG: L hemiparesis/ muscle weakness  THERAPY DIAG:  Hemiplegia and hemiparesis following cerebral infarction affecting left non-dominant side (HCC)  Muscle weakness (generalized)  Spastic hemiplegia of left nondominant side as late effect of cerebral infarction (HCC)  Decreased independence with transfers  Rationale for Evaluation and Treatment: Rehabilitation  SUBJECTIVE:                                                                                                                                                                                             SUBJECTIVE STATEMENT: Pt. Presents to clinic in w/c with history of intracranial hemorrhage with left hemiparesis and dysphagia post stroke.  Pt. Known to PT clinic with tx. In 2022/2023.  Pt. Living with mother and brother and requires max assistance with most daily tasks/ transfers for safely.  Pt. Has a motorized scooter at home and ramp access.  Pt. Using manual tx. With assist with family members.  Pt. Reports L shoulder/ back pain at rest.  Pt accompanied by: family member  PERTINENT HISTORY:  Subjective:   Barry Olson is a 47 y.o. male in today with issue with his left hand.  He has been getting more contractured. He has left-sided hemiparesis from an intracranial bleed years ago.  Current Outpatient Medications  Medication Sig Dispense Refill  amLODIPine (NORVASC) 5 MG tablet Take 1 tablet by mouth once daily 90 tablet 0  baclofen (LIORESAL) 5 mg tablet Take 1 tablet (5 mg total) by mouth nightly 30 tablet 5  gabapentin (NEURONTIN) 300 MG capsule Take 1 capsule by mouth twice daily 60 capsule 2  lisinopriL (ZESTRIL) 40 MG tablet Take 1 tablet by mouth once daily 90 tablet 0  cyanocobalamin (VITAMIN  B12) 1000 MCG tablet Take 1 tablet (1,000 mcg total) by mouth once daily 30 tablet 1   No current facility-administered medications for this visit.   Allergies as of 12/07/2022  (No Known Allergies)   Past Medical History:  Diagnosis Date  Hypertension   No past surgical history on file.  No family history on file.  Social History: reports that he has never smoked. His smokeless tobacco use includes chew. He reports current alcohol use of about 6.0 standard drinks of alcohol per week. He reports that he does not use drugs.  Results for orders placed or performed in visit on 11/23/21  Comprehensive Metabolic Panel (CMP)  Result Value Ref Range  Glucose 89 70 - 110 mg/dL  Sodium 409 811 - 914 mmol/L  Potassium 3.6 3.6 - 5.1 mmol/L  Chloride 105 97 - 109 mmol/L  Carbon Dioxide (CO2) 28.6 22.0 - 32.0 mmol/L  Urea Nitrogen (BUN) 14 7 - 25 mg/dL  Creatinine 1.3 0.7 - 1.3 mg/dL  Glomerular Filtration Rate (eGFR) 72 >60 mL/min/1.73sq m  Calcium 9.6 8.7 - 10.3 mg/dL  AST 11 8 - 39 U/L  ALT 11 6 - 57 U/L  Alk Phos (alkaline Phosphatase) 108 (H) 34 - 104 U/L  Albumin 4.3 3.5 - 4.8 g/dL  Bilirubin, Total 0.4 0.3 - 1.2 mg/dL  Protein, Total 7.9 6.1 - 7.9 g/dL  A/G Ratio 1.2 1.0 - 5.0 gm/dL  CBC w/auto Differential (5 Part)  Result Value Ref Range  WBC (White Blood Cell Count) 6.6 4.1 - 10.2 10^3/uL  RBC (Red Blood Cell Count) 4.63 (L) 4.69 - 6.13 10^6/uL   Hemoglobin 13.1 (L) 14.1 - 18.1 gm/dL  Hematocrit 78.2 (L) 95.6 - 52.0 %  MCV (Mean Corpuscular Volume) 85.7 80.0 - 100.0 fl  MCH (Mean Corpuscular Hemoglobin) 28.3 27.0 - 31.2 pg  MCHC (Mean Corpuscular Hemoglobin Concentration) 33.0 32.0 - 36.0 gm/dL  Platelet Count 213 086 - 450 10^3/uL  RDW-CV (Red Cell Distribution Width) 13.8 11.6 - 14.8 %  MPV (Mean Platelet Volume) 9.8 9.4 - 12.4 fl  Neutrophils 3.36 1.50 - 7.80 10^3/uL  Lymphocytes 2.63 1.00 - 3.60 10^3/uL  Monocytes 0.38 0.00 - 1.50 10^3/uL  Eosinophils 0.15 0.00 - 0.55 10^3/uL  Basophils 0.06 0.00 - 0.09 10^3/uL  Neutrophil % 50.9 32.0 - 70.0 %  Lymphocyte % 39.8 10.0 - 50.0 %  Monocyte % 5.8 4.0 - 13.0 %  Eosinophil % 2.3 1.0 - 5.0 %  Basophil% 0.9 0.0 - 2.0 %  Immature Granulocyte % 0.3 <=0.7 %  Immature Granulocyte Count 0.02 <=0.06 10^3/L  Lipid Panel w/calc LDL  Result Value Ref Range  Cholesterol, Total 118 100 - 200 mg/dL  Triglyceride 61 35 - 578 mg/dL  HDL (High Density Lipoprotein) Cholesterol 35.5 29.0 - 71.0 mg/dL  LDL Calculated 70 0 - 130 mg/dL  VLDL Cholesterol 12 mg/dL  Cholesterol/HDL Ratio 3.3  PSA, Total (Screen)  Result Value Ref Range  PSA (Prostate Specific Antigen), Total 0.80 0.10 - 4.00 ng/mL  Narrative  Test results were determined with Beckman Coulter Hybritech Assay. Values obtained with different assay methods cannot be used interchangeably in serial testing. Assay results should not be interpreted as absolute evidence of the presence or absence of malignant disease   ROS:  General: No fever, chills or recent illness. No change in weight Skin: No skin lesions, growths, masses, rashes, pruritus  HEENT: No change in vision or hearing. No pain or difficulty with swallowing Respiratory: No cough or shortness of  breath CV: No chest pain or palpitations GI: No pain, dyspepsia or change in bowel habits GU: No dysuria, frequency, or hesitancy MSK: No joint pain or injury Neurological:  Positive for left hemiparesis Objective:   There is no height or weight on file to calculate BMI.  BP (!) 140/100  Pulse 70  SpO2 98%   General: WD/WN male, in no acute distress Neuro: CN grossly intact. Hemiparesis  Assessment/Plan:   Hemiparesis affecting left side as late effect of stroke (CMS/HHS-HCC) (primary encounter diagnosis) Primary hypertension Impaired mobility and ADLs  Assessment and Plan  1. Left hemiparesis. Says he is having more contracture. Will order physical therapy and Occupational Therapy to work with him. 2. Mobility assessment. Patient suffers from severe left hemiparesis of upper and lower extremities. This impairs the ability to perform activities of daily living such as feeding and bathing at home. A walker will not resolve his issue. The patient is unable to propel a standard wheelchair therefore a motorized wheelchair would allow the patient to perform all of his ADLs and be more mobile.   PAIN:  Are you having pain? Yes: NPRS scale: not rated/10 Pain location: L shoulder/ low back Pain description: N/A Aggravating factors: L shoulder PROM Relieving factors: positioning  PRECAUTIONS: Fall  RED FLAGS: None   WEIGHT BEARING RESTRICTIONS: No  FALLS: Has patient fallen in last 6 months? No  LIVING ENVIRONMENT: Lives with: lives with their family Lives in: House/apartment Stairs: No Has following equipment at home: Single point cane, Wheelchair (manual), and Ramped entry  PLOF: Independent before Stroke.  PATIENT GOALS: improve mobility at home/ power w/c for greater independence/ decrease L shoulder/ UE pain  OBJECTIVE:   DIAGNOSTIC FINDINGS: See MD notes  COGNITION: Overall cognitive status: Within functional limits for tasks assessed   SENSATION: Not tested  COORDINATION: NT  EDEMA:  Not measured  MUSCLE TONE: LLE: Moderate L UE: Moderate  POSTURE: rounded shoulders, forward head, increased thoracic kyphosis, and flexed  trunk   LOWER/UPPER EXTREMITY ROM:     Supine L shoulder flexion PROM: 125 deg. (Pain), elbow flexion: 153 deg. AAROM, elbow ext. -18 deg. (Pain). L sh. ER 43 deg. AAROM.  Pt. Has L hand splint and CPM to assist with ROM at home.    Supine L hip SLR: 68 deg. (Challenged)- significant quad lag. Supine L ankle DF: -28 deg. (No AROM)- pt. Has AFO but left at home.  L knee flexion 131 deg.   LOWER/UPPER EXTREMITY MMT:    No UE testing   R/L 4/4- Hip flexion 4/4- Hip abduction 4/4- Hip adduction 4/4- Knee extension 4/4- Knee flexion 4/4- Ankle dorsiflexion  BED MOBILITY:  Supine to sit Modified independence  TRANSFERS: Assistive device utilized: Wheelchair (manual)  Sit to stand: Max A Stand to sit: Mod A Chair to chair: Max A Floor: NT  RAMP:  Level of Assistance: NT Assistive device utilized: Wheelchair (manual) Ramp Comments: assist from caregiver  STAIRS: Level of Assistance: Unable to complete stairs  GAIT: Gait pattern: N/A Distance walked: N/A Assistive device utilized: N/A Level of assistance: N/A  FUNCTIONAL TESTS:  5 times sit to stand: TBD  PATIENT SURVEYS:  FOTO initial 34/ goal 48.    TODAY'S TREATMENT:  DATE: 06/15/23  Subjective: Pt reports doing well today. Pt reports he has not been able to do any walking at home since he was here last.  No new complaints of pain today.  Pt. Has not worn hand/wrist splint to prevent flexion contractures.    Gait training:    Ambulation with HW in hallway: 3x20 feet with CGA for safety - pt limited in all his walks today secondary to increased R knee pain with prolonged weightbearing  Walking in //-bars with cuing to increase BoS: 4 laps,  CGA for safety, using hemi-walker- cues for putting weight through L heel during swing through with R LE to avoid clonus  There.ex.:    Nustep L4 10  min. B LE/ R UE (moderate cuing to maintain L LE midline position).  Seated L shoulder AA/PROM (flexion, abduction)- 2x during rest breaks from walking.    STS x5 interspersed throughout session from chair: cueing to try and push from armrests instead of pulling with R UE on // bars  Neuro:  Alternating 6 inch foot taps: unilateral UE support, CGA; to improve weight shifting to L LE, pt still struggles fully putting weight through heel.  1 episode of L knee buckling requiring mod-max assist to prevent fall and sit in chair.    Standing wt. Shifting at front of stairs with R UE support and CGA for safety.      PATIENT EDUCATION: Education details: Geologist, engineering use of w/c Person educated: Patient Education method: Medical illustrator Education comprehension: verbalized understanding and returned demonstration  HOME EXERCISE PROGRAM: Access Code: [email or web address] URL: [email or web address] Date: 05/09/2023 Prepared by: Dorene Grebe Exercises - Sit to Stand with Counter Support - 2 x daily - 4-5 x weekly - 2 sets - 10 reps - Seated Hip Abduction with Resistance - 2 x daily - 4-5 x weekly - 2 sets - 10 reps - Seated March with Resistance - 2 x daily - 4-5 x weekly - 2 sets - 10 reps - Standing Balance with Eyes Open - 2 x daily - 7 x weekly - 2 sets - 10 reps   GOALS: Goals reviewed with patient? Yes  SHORT TERM GOALS: Target date: 06/20/23  Pt will be independent with HEP in order to improve strength and decrease back pain in order to improve seated tolerance at home. Baseline:  Pt. Limited with ex./ see above for ROM/ MMT deficits Goal status: Partially met   LONG TERM GOALS: Target date: 07/04/23  Pt will increase FOTO score to predicted value of 48 for improvement in self-reported function with ADL.  Baseline:  initial 34, 45 (9/18), 10/30: 51 Goal status: GOAL MET (10/30)  2.  Pt will tolerate 5 mins of standing in // bars with Min A to promote WB and  decreased pressure to post pelvic in seated/supine posture  Baseline:  max. Assist in //-bars, pt able to stand full 5 mins with min A from PT and unilateral UE support Goal status: GOAL MET (9/18)  3.  Pt. Will be fitted for power w/c to improve household/ community mobility.   Baseline: limited with manual w/c Goal status: INITIAL  4.  Pt. Able to safely transfer from bed to w/c with mod I safely to decrease need for caregiver assist/ improve independence.  Baseline: max. Assist, 10/30: pt reports being able to get out of bed independently Goal status: Not met, progressing  5. Pt will be able to ambulate >/= 100 feet consecutively with use of hemiwalker in  order to demonstrate improved walking tolerance.  Baseline: ~60-70 feet from car to clinic, 10/30: pt able to walk about 20 feet before needing to sit due to R knee pain  Goal status: Not met, progressing  ASSESSMENT:  CLINICAL IMPRESSION: Pt. presents to PT with reports of no pain. Pt continues to do well with standing endurance/tolerance and does not require any PT support when maintaining his standing balance within the // bars. Pt's ambulation distance was limited today secondary to increased R knee pain with prolonged weightbearing.  The rest of the session consisted of gait training with emphasis on widening the pt's BoS throughout his walk, as well as weightbearing through his L heel to avoid onset of clonus. Pt educated on importance of increasing consistency with performing his HEP in order to improve carryover for walking tolerance. Pt will benefit from continued emphasis on increasing overall standing/walking tolerance with hemiwalker for improved home and community mobility. Pt would benefit from power w/c referral to improve mobility at home/ community.    OBJECTIVE IMPAIRMENTS: Abnormal gait, decreased activity tolerance, decreased balance, decreased coordination, decreased endurance, decreased knowledge of use of DME,  decreased mobility, difficulty walking, decreased ROM, decreased strength, hypomobility, impaired flexibility, improper body mechanics, and pain.   ACTIVITY LIMITATIONS: carrying, lifting, standing, squatting, transfers, bed mobility, bathing, toileting, dressing, and locomotion level  PARTICIPATION LIMITATIONS: meal prep, cleaning, and community activity  PERSONAL FACTORS: Past/current experiences are also affecting patient's functional outcome.   REHAB POTENTIAL: Good  CLINICAL DECISION MAKING: Evolving/moderate complexity  EVALUATION COMPLEXITY: Moderate  PLAN:  PT FREQUENCY: 2x/week  PT DURATION: 6 weeks  PLANNED INTERVENTIONS: Therapeutic exercises, Therapeutic activity, Neuromuscular re-education, Balance training, Gait training, Patient/Family education, Self Care, Joint mobilization, Stair training, Visual/preceptual remediation/compensation, Orthotic/Fit training, DME instructions, Splintting, Taping, Manual therapy, and Re-evaluation  PLAN FOR NEXT SESSION: Progress standing/walking exercises as tolerated.  Use of Hemiwalker.  Review new HEP  Cammie Mcgee, PT, DPT # [phone] Physical Therapist - [facility]  06/18/23, 11:46 AM

## 2023-06-22 ENCOUNTER — Ambulatory Visit: Payer: Medicare Other | Admitting: Physical Therapy

## 2023-06-29 ENCOUNTER — Ambulatory Visit: Payer: Medicare Other | Admitting: Physical Therapy

## 2023-07-03 ENCOUNTER — Ambulatory Visit: Payer: Medicare Other | Admitting: Physical Therapy
